# Patient Record
Sex: Male | Born: 1980
Health system: Southern US, Community
[De-identification: ages and names within clinical notes are randomized; demographics above are authoritative.]

## PROBLEM LIST (undated history)

## (undated) DIAGNOSIS — B019 Varicella without complication: Secondary | ICD-10-CM

## (undated) DIAGNOSIS — S83249A Other tear of medial meniscus, current injury, unspecified knee, initial encounter: Secondary | ICD-10-CM

## (undated) DIAGNOSIS — M7630 Iliotibial band syndrome, unspecified leg: Secondary | ICD-10-CM

## (undated) DIAGNOSIS — M79673 Pain in unspecified foot: Secondary | ICD-10-CM

## (undated) HISTORY — DX: Varicella without complication: B01.9

## (undated) HISTORY — PX: HERNIA REPAIR: SHX51

## (undated) HISTORY — DX: Pain in unspecified foot: M79.673

## (undated) HISTORY — PX: KNEE SURGERY: SHX244

## (undated) HISTORY — PX: ADENOIDECTOMY: SUR15

## (undated) HISTORY — DX: Other tear of medial meniscus, current injury, unspecified knee, initial encounter: S83.249A

## (undated) HISTORY — DX: Iliotibial band syndrome, unspecified leg: M76.30

---
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
## 1998-05-22 HISTORY — PX: MANDIBLE FRACTURE SURGERY: SHX706

## 2010-02-21 ENCOUNTER — Ambulatory Visit: Payer: Self-pay | Admitting: Family Medicine

## 2010-02-21 DIAGNOSIS — M25579 Pain in unspecified ankle and joints of unspecified foot: Secondary | ICD-10-CM | POA: Insufficient documentation

## 2010-02-21 DIAGNOSIS — M25569 Pain in unspecified knee: Secondary | ICD-10-CM | POA: Insufficient documentation

## 2010-02-28 ENCOUNTER — Encounter (INDEPENDENT_AMBULATORY_CARE_PROVIDER_SITE_OTHER): Payer: Self-pay | Admitting: *Deleted

## 2010-02-28 ENCOUNTER — Encounter: Payer: Self-pay | Admitting: Family Medicine

## 2010-03-02 ENCOUNTER — Encounter
Admission: RE | Admit: 2010-03-02 | Discharge: 2010-04-19 | Payer: Self-pay | Source: Home / Self Care | Admitting: Family Medicine

## 2010-03-11 ENCOUNTER — Encounter: Payer: Self-pay | Admitting: Family Medicine

## 2010-04-26 ENCOUNTER — Ambulatory Visit: Payer: Self-pay | Admitting: Sports Medicine

## 2010-04-26 DIAGNOSIS — M79609 Pain in unspecified limb: Secondary | ICD-10-CM

## 2010-05-31 ENCOUNTER — Ambulatory Visit
Admission: RE | Admit: 2010-05-31 | Discharge: 2010-05-31 | Payer: Self-pay | Source: Home / Self Care | Attending: Sports Medicine | Admitting: Sports Medicine

## 2010-06-23 NOTE — Assessment & Plan Note (Signed)
Summary: ORTHOTICS,[facility]   CC:  orthotics.  History of Present Illness: 30yo male to office for custom orthotics.  Has been having left foot/ankle pain for several months, last office vist noted to have small avulsion at tarsal-tarsal junction between cuboid & lateral cuneiform on MSK u/s.  He continues to have pain with inversion/eversion of the foot.  Denies any swelling.  Able to run 3-miles this weekend without pain.  No pain with running as long as not doing any cutting or side-to-side movement.  Using sports insoles in shoes which are comfortable.  Here for custom orthotics today.  Physical Exam  General:  Well-developed,well-nourished,in no acute distress; alert,appropriate and cooperative throughout examination Msk:  KNEES: b/l knees with FROM without pain or swelling.  No ligamentous laxity.  No tenderness.  Normal strength.  ANKLES:  - Lt ankle: no swelling.  Full ROM, some foot pain with inversion & eversion.  No laxity with anterior drawer or talar tilt.  No TTP along medial/lateral malleoli.  No tenderness along talar dome.  neg squezze, neg kleiger.  Normal strength. - Rt ankle: Full ROM without pain, swelling, tenderness, laxity, or weakness.  FEET:  bilateral pes planus & loss of transverse arch.  b/l morton's callous.  mild TTP over tarsal-tarsal junction of cubiod & lateral cuneiform on left, no swelling.  Increased pain in this area with inversion/eversion of the foot.  No erythema or bruising.  No tenderness on right foot.  GAIT: no leg length difference.  Mild pronation on left with walking. Pulses:  +2/4 DP & PT Neurologic:  sensation intact to light touch.     Impression & Recommendations:  Problem # 1:  FOOT PAIN, LEFT (ICD-729.5)  - small avulsion at tarsal-tarsal joint between cuboid & lateral cuneiform with surrounding fluid that was seen on MSK u/s last visit - Fitted with custom orthotics in office today: Patient was fitted for a : standard, cushioned,  semi-rigid orthotic. The orthotic was heated and afterward the patient stood on the orthotic blank positioned on the orthotic stand. The patient was positioned in subtalar neutral position and 10 degrees of ankle dorsiflexion in a weight bearing stance. After completion of molding, a stable base was applied to the orthotic blank. The blank was ground to a stable position for weight bearing. Size: 16 - blue swirl Base: blue EVA foam Posting: none Additional orthotic padding: none Orthotic comfortable to patient in office today. 45-mins spent with patient for orthotics preparation - cont. arch straps for added compression - advance activity as tolerated - f/u prn  Orders: Orthotic Materials, each unit (Z6109)   Orders Added: 1)  Est. Patient Level IV [60454] 2)  Orthotic Materials, each unit [L3002]

## 2010-06-23 NOTE — Assessment & Plan Note (Signed)
Summary: 4PM APPT/WOULD ALSO LIKE TO SEE WITH BOTH [facility]   Vitals Entered By: Lillia Pauls CMA (April 27, 2010 2:14 PM)  CC:  f/u L foot/ankle pain.  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  Pain has been ongoing for several months.  Pain is mainly in anterior aspect of ankle, but also on dorsal aspect of foot.  Underwent intra-articular ankle injection by Dr. Jennette Kettle 1-week ago which improved ankle pain, but now having more prominent foot pain.  Pain most noticeable at rest, but does have some pain with activity.  Pain worst with inversion or eversion of foot.  Has mild swelling, no erythema or bruising.  Has been using sports insoles in his shoes.    Is a runner, has completed marathons in the past.  Has done fair amount of trail running & likely injured foot, but does not recall any specific event.  Has not been running recently.  During last visit had some right knee pain, but this has completely resolved. PMH-FH-SH reviewed for relevance  Review of Systems      See HPI  Physical Exam  General:  Well-developed,well-nourished,in no acute distress; alert,appropriate and cooperative throughout examination Msk:  KNEES: b/l knees with FROM without pain or swelling.  No ligamentous laxity.  Neg mcmurray.  No joint line tenderness.  Good hamstring & quad strength.  ANKLES:  - Lt ankle: no swelling.  Full ROM, some foot pain with inversion & eversion.  No laxity with anterior drawer or talar tilt.  No TTP along medial/lateral malleoli.  No tenderness along talar dome.  neg squezze, neg kleiger.  Normal strength. - Rt ankle: Full ROM without pain, swelling, tenderness, laxity, or weakness.  FEET:  bilateral pes planus & loss of transverse arch.  TTP over tarsal-tarsal junction of cubiod & lateral cuneiform on left, some mild soft tissue swelling in this area.  Increased pain in this area with  inversion/eversion of the foot.  Mild tenderness along 2nd, 3rd, 4th MTP joints.  No erythema or bruising.  No tenderness on right foot. Additional Exam:  MSK U/S: L foot - normal appearing MTs and MTP joints.  Small hyperechoic area noted at tarsal-tarsal joint between cuboid and lateral cuneiform - likely small avusion fleck.  This fleck is surrounding by fluid that has appearance of bursal sack.  Ultrasound findings correlate with area of maximal tenderness.  Images saved.   Impression & Recommendations:  Problem # 1:  FOOT PAIN, LEFT (ICD-729.5)  - MSK U/S showed likely small avulsion at tarsal-tarsal joint between cuboid & lateral cuneiform with surrounding fluid - Fitted with arch straps for added compression - Ok to advance activity as pain allows - Return to office in 2-weeks for custom orthotics  Orders: [place] LIMITED ([id number]) Foot Orthosis ( Arch Strap/Heel Cup) [phone])  Problem # 2:  ANKLE PAIN, LEFT (ICD-719.47) Assessment: Improved  - Improved after intra-articular injection.   - Custom orthotics should also help his ankle pain  Orders: [place] LIMITED ([id number]) Foot Orthosis ( Arch Strap/Heel Cup) [phone])   Orders Added: 1)  Est. Patient Level III [29562] 2)  [place] LIMITED [13086] 3)  Foot Orthosis ( Arch Strap/Heel Cup) [V7846]

## 2010-06-23 NOTE — Miscellaneous (Signed)
  Clinical Lists Changes  Observations: Added new observation of MSK EXAM: Right knee has indistinct endpoint on posterior drawer and + sag sign. There is an endpoint on the anterior drawer but it is not as fiem as the left knee. Negative McMurray, Small knee effusion. TTP latearlly over the fibular head.  GAIT analysis: runniong gait is symmetrical with no obvious problems. A buit of a short stride length. (02/28/2010 9:45) Added new observation of PEADULT: Denny Levy MD ~Additional Exam`PE comments ~Msk`MSK EXAM (02/28/2010 9:45) Added new observation of PE COMMENTS: [place] right knee--small effusion in suprapatellar pouch. Popliteus muscle appears ragged and seems split into at least two fragments as itnears the femur. Some calcifications surrounding popliteus and lateral meniscus--the meniscus is indistinct. the medial meniscus seems thinner at femoral portion but no specific tear is noted and borders are distinct. (02/28/2010 9:45)      Impression & Recommendations:  Problem # 1:  KNEE PAIN, RIGHT (ICD-719.46) unstable knee--previous PCL tear that was not repaired. also s/p meniscal injury with subsequent scope. Discussed options and wuld recommend a good stabilizing brace--rx given and he will go to [facility]. We looked at the [facility] on line. Also will send to PT--he is basically a runner not a cyclist so he may be too hamstring diominant.   Physical Exam  Msk:  Right knee has indistinct endpoint on posterior drawer and + sag sign. There is an endpoint on the anterior drawer but it is not as fiem as the left knee. Negative McMurray, Small knee effusion. TTP latearlly over the fibular head.  GAIT analysis: runniong gait is symmetrical with no obvious problems. A buit of a short stride length. Additional Exam:  [place] right knee--small effusion in suprapatellar pouch. Popliteus muscle appears ragged and seems split into at least two fragments as itnears the femur. Some calcifications surrounding  popliteus and lateral meniscus--the meniscus is indistinct. the medial meniscus seems thinner at femoral portion but no specific tear is noted and borders are distinct.

## 2010-06-23 NOTE — Letter (Signed)
Summary: [facility] PT Referral form  [facility] PT Referral form   Imported By: Marily Memos 02/28/2010 12:23:38  _____________________________________________________________________  External Attachment:    Type:   Image     Comment:   External Document

## 2010-06-23 NOTE — Miscellaneous (Signed)
Summary: [facility]  [facility] rehab center   Imported By: Marily Memos 04/21/2010 14:01:34  _____________________________________________________________________  External Attachment:    Type:   Image     Comment:   External Document

## 2010-06-23 NOTE — Miscellaneous (Signed)
  Clinical Lists Changes  Orders: Added new Referral order of Physical Therapy Referral (PT) - Signed

## 2010-06-23 NOTE — Assessment & Plan Note (Signed)
Summary: NP,LEFT ANKLE PAIN,MC   Vital Signs:  Patient profile:   30 year old male Height:      76 inches Weight:      230 pounds BMI:     28.10 Pulse rate:   42 / minute BP sitting:   127 / 79  (left arm)  Vitals Entered By: Rochele Pages RN (February 21, 2010 2:10 PM) CC: Lt ankle/foot pain, rt lateral knee pain   CC:  Lt ankle/foot pain and rt lateral knee pain.  History of Present Illness: left ankle pain---off and n for several (6) moths, worse last 4 weeks as he has been training for a marathon in November. Has run marathon before. Last two days has been so painful he has not run at all. Pain is anterior ankle, a sharp sensation. Can reproduce it with a quick dorsiflexion of ankle starting from plantar flexed position.  Aching pain after he feels the sharp pain. Worse as he runs  > 3 miles.  Also having right ITB pain. Has had this intermittently before but worse now. Pain with quad stretch  Current Medications (verified): 1)  None  Past History:  Past Surgical History: Right PCL tear, rigt knee surgery  Physical Exam  General:  alert, well-developed, well-nourished, and well-hydrated.   Msk:  Right knee TTP at hamstring insertion and along distal ITB. FROM Knee. No effusion, no erythema. No joint line tenderness.  B feet pes planus, loss of transverse arch. Pain ober anterior joint line with rapid dorsif;exion from a plantarflexed start.  [place]: edema around insertion of right hamstring.  ANKLE: bone spurs seen anterior joint line. No ankle effusion Additional Exam:  GAIT eval walking: left ankle :somewhat mobilesubtalar joint noted during stance and toe off phase   Impression & Recommendations:  Problem # 1:  KNEE PAIN, RIGHT (ICD-719.46)  Problem # 2:  ANKLE PAIN, LEFT (ICD-719.47) Would benefit from running gait analysis--will rtc for that and custom orthotics

## 2010-11-25 ENCOUNTER — Other Ambulatory Visit: Payer: Self-pay | Admitting: Family Medicine

## 2010-11-25 MED ORDER — CIPROFLOXACIN HCL 500 MG PO TABS: 500.0000 mg | ORAL_TABLET | Freq: Two times a day (BID) | ORAL | Status: DC

## 2010-12-05 ENCOUNTER — Ambulatory Visit (INDEPENDENT_AMBULATORY_CARE_PROVIDER_SITE_OTHER): Payer: Self-pay | Admitting: Family Medicine

## 2010-12-05 ENCOUNTER — Encounter: Payer: Self-pay | Admitting: Family Medicine

## 2010-12-05 VITALS — BP 124/74 | HR 64 | Temp 98.7°F

## 2010-12-05 DIAGNOSIS — R509 Fever, unspecified: Secondary | ICD-10-CM | POA: Insufficient documentation

## 2010-12-05 LAB — CBC WITH DIFFERENTIAL/PLATELET
Eosinophils Absolute: 0 10*3/uL (ref 0.0–0.7)
Eosinophils Relative: 0 % (ref 0–5)
HCT: 46.6 % (ref 39.0–52.0)
Hemoglobin: 16.3 g/dL (ref 13.0–17.0)
Lymphs Abs: 1.2 10*3/uL (ref 0.7–4.0)
MCH: 32.7 pg (ref 26.0–34.0)
MCV: 93.4 fL (ref 78.0–100.0)
Monocytes Relative: 16 % — ABNORMAL HIGH (ref 3–12)
RBC: 4.99 MIL/uL (ref 4.22–5.81)

## 2010-12-05 LAB — POCT UA - MICROSCOPIC ONLY

## 2010-12-05 LAB — POCT URINALYSIS DIPSTICK
Glucose, UA: NEGATIVE
Leukocytes, UA: NEGATIVE
pH, UA: 5.5

## 2010-12-05 NOTE — Assessment & Plan Note (Addendum)
Likely mosquito borne illness dengue, less likely malaria.  Nonspecific viral illness also possible. Will check CBC and Malaria smear  Given CVAT will check UA. No specific treatment other than analgesics for pain

## 2010-12-05 NOTE — Progress Notes (Signed)
  Subjective:    Patient ID: Ronald Durham, male    DOB: Aug 25, 1980, 30 y.o.   MRN: [id number]  HPI  Fever Fever started Sunday after he returned from a week in the mountains of [place] on Saturday where he was in contact with mosquitos.  Had a transient GI symptoms of diarrhea and vomiting without blood or fever on Wednesday took cipro for 3 days which he has finished. Currently has diffuse body aches, with headache and low back pain L > R.   No stiff neck, rash, cough, URI, dysuria, penile discharge, adenopathy  PMH - no prior tick bites this year.  No chronic meds or illnesses   Review of Symptoms - see HPI       Review of Systems     Objective:   Physical Exam     Alert ill appearing in NAD Neck:  No deformities, thyromegaly, masses, or tenderness noted.   Supple with full range of motion without pain. Lungs:  Normal respiratory effort, chest expands symmetrically. Lungs are clear to auscultation, no crackles or wheezes. Heart - Regular rate and rhythm.  No murmurs, gallops or rubs.    Abdomen: soft and non-tender without masses, organomegaly or hernias noted.  No guarding or rebound, No spleen Extremities:  No cyanosis, edema, or deformity noted with good range of motion of all major joints.   Skin:  Intact without suspicious lesions or rashes Back - diffusely tender including CVAT on the R > L  Eye - Pupils Equal Round Reactive to light, Extraocular movements intact, , Conjunctiva without redness or discharge or jaundice    Assessment & Plan:

## 2010-12-05 NOTE — Progress Notes (Signed)
Addended by: [place], Esiah Bazinet on: 12/05/2010 12:11 PM   Modules accepted: Orders

## 2010-12-06 LAB — MALARIA SMEAR: Result - MALAR: NEGATIVE

## 2011-03-23 ENCOUNTER — Other Ambulatory Visit: Payer: Self-pay | Admitting: Family Medicine

## 2011-03-23 MED ORDER — NITROGLYCERIN 0.2 MG/HR TD PT24: MEDICATED_PATCH | TRANSDERMAL | Status: DC

## 2011-05-29 ENCOUNTER — Ambulatory Visit
Admission: RE | Admit: 2011-05-29 | Discharge: 2011-05-29 | Disposition: A | Discharge status: Home / Self Care | Payer: 59 | Source: Ambulatory Visit | Attending: Family Medicine | Admitting: Family Medicine

## 2011-05-29 ENCOUNTER — Telehealth: Payer: Self-pay | Admitting: Family Medicine

## 2011-05-29 DIAGNOSIS — M79672 Pain in left foot: Secondary | ICD-10-CM

## 2011-05-29 NOTE — Telephone Encounter (Signed)
Dr. Gwendolyn Grant has left foot pain for >1 year on dorsal aspect of tarsals along 3rd ray. Xray requested.

## 2011-06-28 ENCOUNTER — Other Ambulatory Visit: Payer: Self-pay | Admitting: Family Medicine

## 2011-06-28 ENCOUNTER — Encounter: Payer: Self-pay | Admitting: *Deleted

## 2011-06-28 ENCOUNTER — Other Ambulatory Visit: Payer: Self-pay | Admitting: *Deleted

## 2011-06-28 ENCOUNTER — Other Ambulatory Visit (HOSPITAL_COMMUNITY): Payer: Self-pay | Admitting: *Deleted

## 2011-06-28 DIAGNOSIS — M79609 Pain in unspecified limb: Secondary | ICD-10-CM

## 2011-06-28 DIAGNOSIS — M25579 Pain in unspecified ankle and joints of unspecified foot: Secondary | ICD-10-CM

## 2011-06-28 NOTE — Progress Notes (Signed)
Patient ID: Ronald Durham, male   DOB: February 26, 1981, 31 y.o.   MRN: [id number] Your appt for the MRA is on Friday, February the 8th at 8am at [facility]. Call [phone] if you have questions with the appt time and date.  Arrive at 7:45 in admitting for registration.

## 2011-06-28 NOTE — Progress Notes (Signed)
Amy can you schedule this  As an MRA---I put in for mri and I cannot remember how to do it  for Dr Gwendolyn Grant? [phone]  Mid afternoon or after 5 pm best THANKS! Denny Levy

## 2011-06-28 NOTE — Progress Notes (Signed)
[place] scheduled test.

## 2011-06-29 ENCOUNTER — Other Ambulatory Visit: Payer: Self-pay | Admitting: Family Medicine

## 2011-06-29 DIAGNOSIS — M79609 Pain in unspecified limb: Secondary | ICD-10-CM

## 2011-06-29 DIAGNOSIS — M25579 Pain in unspecified ankle and joints of unspecified foot: Secondary | ICD-10-CM

## 2011-06-30 ENCOUNTER — Inpatient Hospital Stay (HOSPITAL_COMMUNITY): Admission: RE | Admit: 2011-06-30 | Payer: 59 | Source: Ambulatory Visit

## 2011-07-04 ENCOUNTER — Other Ambulatory Visit (HOSPITAL_COMMUNITY): Payer: 59

## 2011-07-04 ENCOUNTER — Inpatient Hospital Stay (HOSPITAL_COMMUNITY): Admission: RE | Admit: 2011-07-04 | Payer: 59 | Source: Ambulatory Visit

## 2011-07-13 ENCOUNTER — Ambulatory Visit (HOSPITAL_COMMUNITY)
Admission: RE | Admit: 2011-07-13 | Discharge: 2011-07-13 | Disposition: A | Discharge status: Home / Self Care | Payer: 59 | Source: Ambulatory Visit | Attending: Family Medicine | Admitting: Family Medicine

## 2011-07-13 DIAGNOSIS — M25579 Pain in unspecified ankle and joints of unspecified foot: Secondary | ICD-10-CM | POA: Insufficient documentation

## 2011-07-13 DIAGNOSIS — M79609 Pain in unspecified limb: Secondary | ICD-10-CM

## 2011-07-13 MED ORDER — IOHEXOL 300 MG/ML  SOLN
50.0000 mL | Freq: Once | INTRAMUSCULAR | Status: AC | PRN
  Administered 2011-07-13: 10 mL

## 2011-07-13 MED ORDER — GADOBENATE DIMEGLUMINE 529 MG/ML IV SOLN
5.0000 mL | Freq: Once | INTRAVENOUS | Status: AC | PRN
  Administered 2011-07-13: 0.1 mL via INTRAVENOUS

## 2011-07-13 NOTE — Procedures (Signed)
Clinical Data: [Left ankle pain.] Fluoroscopically guided left tibiotalar joint injection, pre MRI Comparison: [05/29/2011] Procedure:  I discussed the risks (including hemorrhage and infection, among others), benefits, and alternatives to fluoroscopically guided left tibia talar joint injection for MR arthrography with the patient.  We discussed high likelihood of technical success of the procedure.  He understood and  elected to undergo the procedure. Standard procedural and fluoroscopic time-outs were employed. Following sterile skin prep and local subcutaneous anesthetic administration consisting of 1% lidocaine, and after carefully localizing to avoid the dorsalis pedis location and extensor tendons, I advanced a 21 gauge needle into the lateral portion of the tibiotalar joint from an anterior approach under fluoroscopic guidance.  Positioning was confirmed with contrast injection of Omnipaque 300. Subsequently, 10 ml of a combination of 0.1 ml of Multihance dilated in 15 ml of Omnipaque 300 was injected into the joint.  The needle was subsequently removed and the skin cleansed and bandaged.  No immediate complications were observed. IMPRESSION: [ 1.  Technically successful left tibiotalar joint injection, pre MRI.  No immediate complications were observed.]

## 2011-07-16 ENCOUNTER — Telehealth (HOSPITAL_COMMUNITY): Payer: Self-pay | Admitting: Radiology

## 2011-09-09 ENCOUNTER — Other Ambulatory Visit: Payer: Self-pay | Admitting: Family Medicine

## 2011-09-09 MED ORDER — FLUTICASONE PROPIONATE 50 MCG/ACT NA SUSP: 2.0000 | Freq: Every day | NASAL | Status: DC

## 2011-09-09 MED ORDER — OLOPATADINE HCL 0.1 % OP SOLN: 1.0000 [drp] | Freq: Two times a day (BID) | OPHTHALMIC | Status: AC

## 2011-09-18 ENCOUNTER — Telehealth: Payer: Self-pay | Admitting: Family Medicine

## 2011-09-18 MED ORDER — PROMETHAZINE HCL 12.5 MG PO TABS: 12.5000 mg | ORAL_TABLET | Freq: Three times a day (TID) | ORAL | Status: DC | PRN

## 2011-09-18 NOTE — Telephone Encounter (Signed)
Pt traveling to [place].  Would like nausea medications to have incase viral gastroenteritis.  Calling in phenergan.

## 2012-06-03 ENCOUNTER — Ambulatory Visit: Payer: 59 | Admitting: Family Medicine

## 2012-06-06 ENCOUNTER — Ambulatory Visit (INDEPENDENT_AMBULATORY_CARE_PROVIDER_SITE_OTHER): Payer: 59 | Admitting: Family Medicine

## 2012-06-06 ENCOUNTER — Encounter: Payer: Self-pay | Admitting: Family Medicine

## 2012-06-06 VITALS — BP 102/70 | HR 55 | Temp 98.2°F | Ht 76.0 in | Wt 230.0 lb

## 2012-06-06 DIAGNOSIS — Z7689 Persons encountering health services in other specified circumstances: Secondary | ICD-10-CM

## 2012-06-06 DIAGNOSIS — Z7189 Other specified counseling: Secondary | ICD-10-CM

## 2012-06-06 DIAGNOSIS — D239 Other benign neoplasm of skin, unspecified: Secondary | ICD-10-CM

## 2012-06-06 DIAGNOSIS — D229 Melanocytic nevi, unspecified: Secondary | ICD-10-CM

## 2012-06-06 DIAGNOSIS — L658 Other specified nonscarring hair loss: Secondary | ICD-10-CM

## 2012-06-06 DIAGNOSIS — L649 Androgenic alopecia, unspecified: Secondary | ICD-10-CM

## 2012-06-06 MED ORDER — FINASTERIDE 5 MG PO TABS: 5.0000 mg | ORAL_TABLET | Freq: Every day | ORAL | Status: DC

## 2012-06-06 NOTE — Patient Instructions (Addendum)
-  We have ordered labs or studies at this visit. It can take up to 1-2 weeks for results and processing. We will contact you with instructions IF your results are abnormal. Normal results will be released to your MYCHART. If you have not heard from us or can not find your results in MYCHART in 2 weeks please contact our office.  -PLEASE SIGN UP FOR MYCHART TODAY   We recommend the following healthy lifestyle measures: - eat a healthy diet consisting of lots of vegetables, fruits, beans, nuts, seeds, healthy meats such as white chicken and fish and whole grains.  - avoid fried foods, fast food, processed foods, sodas, red meet and other fattening foods.  - get a least 150 minutes of aerobic exercise per week.   Follow up in: 1 year  

## 2012-06-06 NOTE — Progress Notes (Addendum)
Chief Complaint  Patient presents with  . Establish Care    HPI:  Ronald Durham is here to establish care.  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 KNEE PAIN, RIGHT  . ANKLE PAIN, LEFT  . FOOT PAIN, LEFT  . Fever   Health Maintenance: -had flu and up to day on all the vaccines  ROS: See pertinent positives and negatives per HPI.  Past Medical History  Diagnosis Date  . Foot pain   . Acute medial meniscal tear   . IT band syndrome     History reviewed. No pertinent family history.  History   Social History  . Marital Status: Married    Spouse Name: N/A    Number of Children: N/A  . Years of Education: N/A   Social History Main Topics  . Smoking status: Never Smoker   . Smokeless tobacco: None  . Alcohol Use: Yes     Comment: occ - 1 drink on occassion  . Drug Use: No  . Sexually Active: None   Other Topics Concern  . None   Social History Narrative  . None    Current outpatient prescriptions:fluticasone (FLONASE) 50 MCG/ACT nasal spray, Place 2 sprays into the nose daily., Disp: 16 g, Rfl: 6;  Multiple Vitamin (MULTIVITAMIN) tablet, Take 1 tablet by mouth daily., Disp: , Rfl: ;  olopatadine (PATANOL) 0.1 % ophthalmic solution, Place 1 drop into both eyes 2 (two) times daily., Disp: 5 mL,  Rfl: 12 finasteride (PROSCAR) 5 MG tablet, Take 1 tablet (5 mg total) by mouth daily. Use one quarter tablet daily for balding, Disp: 30 tablet, Rfl: 3  EXAM:  Filed Vitals:   06/06/12 1440  BP: 102/70  Pulse: 55  Temp: 98.2 F (36.8 C)    Body mass index is 28.00 kg/(m^2).  GENERAL: vitals reviewed and listed above, alert, oriented, appears well hydrated and in no acute distress  HEENT: no obvious abnormalities on inspection of external nose and ears  LUNGS: clear to auscultation bilaterally, no wheezes, rales or rhonchi, good air movement  CV: HRRR - slow HR which he reports is chronic in this avid runner, no peripheral edema  MS: moves all extremities without noticeable abnormality  SKIN: few moles on abdomen and back that appear similar and benign in appearance, male pattern hair loss - no scarring  PSYCH: pleasant and cooperative, no obvious depression or anxiety  ASSESSMENT AND PLAN:  Discussed the following assessment and plan:  1. Male pattern baldness  -discussed options -pt wants to try finasteride - discussed use and risks, prefers to cut 5mg  tablet, declined PSA monitoring finasteride (PROSCAR) 5 MG tablet  2. Benign moles Offered removal versus dermatology appointment to confirm benign nevus - though moles look similar with no concerning features or notable acute changes per pt. He declined  removal or derm referral for now - but will contact us if changes his mind.  3. Encounter to establish care with new doctor  Offered basic labs - pt would like to hold off on these for now   -We reviewed the PMH, PSH, FH, SH, Meds and Allergies. -We provided refills for any medications we will prescribe as needed. -We addressed current concerns per orders and patient instructions. -We have advised patient to follow up per instructions below.  -Patient advised to return or notify a doctor immediately if symptoms worsen or persist or new concerns arise.  Patient  Instructions  -We have ordered labs or studies at this visit. It can take up to 1-2 weeks for results and processing. We will contact you with instructions IF your results are abnormal. Normal results will be released to your [facility]. If you have not heard from [place] or can not find your results in [facility] in 2 weeks please contact our office.  -PLEASE SIGN UP FOR MYCHART TODAY   We recommend the following healthy lifestyle measures: - eat a healthy diet consisting of lots of vegetables, fruits, beans, nuts, seeds, healthy meats such as white chicken and fish and whole grains.  - avoid fried foods, fast food, processed foods, sodas, red meet and other fattening foods.  - get a least 150 minutes of aerobic exercise per week.   Follow up in: 1 year      Saranya Harlin, Damita Lack.

## 2012-06-07 ENCOUNTER — Other Ambulatory Visit: Payer: Self-pay

## 2012-06-07 DIAGNOSIS — L649 Androgenic alopecia, unspecified: Secondary | ICD-10-CM

## 2012-06-07 MED ORDER — FINASTERIDE 5 MG PO TABS: ORAL_TABLET | ORAL | Status: DC

## 2012-06-07 NOTE — Telephone Encounter (Signed)
Rx resent for proscar- use one quater tablet daily for balding.

## 2012-07-30 ENCOUNTER — Other Ambulatory Visit: Payer: Self-pay | Admitting: Family Medicine

## 2012-07-30 MED ORDER — TRIAMCINOLONE ACETONIDE 0.5 % EX OINT: TOPICAL_OINTMENT | Freq: Two times a day (BID) | CUTANEOUS | Status: DC

## 2012-07-30 NOTE — Telephone Encounter (Signed)
Rash on BL forearms after clearing trees.  Hx of skin hypersensitive to evergreen trees.  No other symptoms. Well otherwise.  Present several days and not improving with OTC hydrocortisone.   Exam:  Maculopapular rash on bl forearms.  A/P hypersensitively vs contact dermatitis.  Plan: Triamcinolone ointment f/u prn

## 2013-03-27 ENCOUNTER — Other Ambulatory Visit: Payer: Self-pay

## 2014-03-06 ENCOUNTER — Other Ambulatory Visit: Payer: Self-pay

## 2015-10-25 DIAGNOSIS — M9902 Segmental and somatic dysfunction of thoracic region: Secondary | ICD-10-CM | POA: Diagnosis not present

## 2015-10-25 DIAGNOSIS — M9901 Segmental and somatic dysfunction of cervical region: Secondary | ICD-10-CM | POA: Diagnosis not present

## 2015-10-25 DIAGNOSIS — M75100 Unspecified rotator cuff tear or rupture of unspecified shoulder, not specified as traumatic: Secondary | ICD-10-CM | POA: Diagnosis not present

## 2015-10-25 DIAGNOSIS — M791 Myalgia: Secondary | ICD-10-CM | POA: Diagnosis not present

## 2015-10-25 DIAGNOSIS — M6240 Contracture of muscle, unspecified site: Secondary | ICD-10-CM | POA: Diagnosis not present

## 2015-10-26 MED FILL — CIPROFLOXACIN HCL 500 MG TA: 500 | 3 days supply | Qty: 6 | Fill #0

## 2015-10-26 MED FILL — ZOLPIDEM TARTRATE 10 MG TAB: 10 | 15 days supply | Qty: 15 | Fill #0

## 2015-10-26 MED FILL — DOXYCYCLINE HYCLATE 100 MG: 100 | 41 days supply | Qty: 41 | Fill #0

## 2015-10-26 MED FILL — ONDANSETRON HCL 4 MG TABLET: 4 | 4 days supply | Qty: 15 | Fill #0

## 2015-10-27 DIAGNOSIS — M9902 Segmental and somatic dysfunction of thoracic region: Secondary | ICD-10-CM | POA: Diagnosis not present

## 2015-10-27 DIAGNOSIS — M791 Myalgia: Secondary | ICD-10-CM | POA: Diagnosis not present

## 2015-10-27 DIAGNOSIS — M9901 Segmental and somatic dysfunction of cervical region: Secondary | ICD-10-CM | POA: Diagnosis not present

## 2015-10-27 DIAGNOSIS — M75100 Unspecified rotator cuff tear or rupture of unspecified shoulder, not specified as traumatic: Secondary | ICD-10-CM | POA: Diagnosis not present

## 2015-10-27 DIAGNOSIS — M6240 Contracture of muscle, unspecified site: Secondary | ICD-10-CM | POA: Diagnosis not present

## 2016-02-04 DIAGNOSIS — M9902 Segmental and somatic dysfunction of thoracic region: Secondary | ICD-10-CM | POA: Diagnosis not present

## 2016-02-04 DIAGNOSIS — M791 Myalgia: Secondary | ICD-10-CM | POA: Diagnosis not present

## 2016-02-04 DIAGNOSIS — R51 Headache: Secondary | ICD-10-CM | POA: Diagnosis not present

## 2016-02-04 DIAGNOSIS — M9901 Segmental and somatic dysfunction of cervical region: Secondary | ICD-10-CM | POA: Diagnosis not present

## 2016-02-28 DIAGNOSIS — M791 Myalgia: Secondary | ICD-10-CM | POA: Diagnosis not present

## 2016-02-28 DIAGNOSIS — R51 Headache: Secondary | ICD-10-CM | POA: Diagnosis not present

## 2016-02-28 DIAGNOSIS — M9902 Segmental and somatic dysfunction of thoracic region: Secondary | ICD-10-CM | POA: Diagnosis not present

## 2016-02-28 DIAGNOSIS — M9901 Segmental and somatic dysfunction of cervical region: Secondary | ICD-10-CM | POA: Diagnosis not present

## 2016-05-09 DIAGNOSIS — D225 Melanocytic nevi of trunk: Secondary | ICD-10-CM | POA: Diagnosis not present

## 2016-05-09 DIAGNOSIS — L814 Other melanin hyperpigmentation: Secondary | ICD-10-CM | POA: Diagnosis not present

## 2016-05-10 ENCOUNTER — Telehealth: Payer: 59 | Admitting: Nurse Practitioner

## 2016-05-10 DIAGNOSIS — J0101 Acute recurrent maxillary sinusitis: Secondary | ICD-10-CM | POA: Diagnosis not present

## 2016-05-10 MED ORDER — IPRATROPIUM BROMIDE 0.03 % NA SOLN: 2.0000 | Freq: Two times a day (BID) | NASAL | 12 refills | Status: DC

## 2016-05-10 MED ORDER — AZITHROMYCIN 250 MG PO TABS: ORAL_TABLET | ORAL | 0 refills | Status: DC

## 2016-05-10 NOTE — Progress Notes (Signed)
We are sorry that you are not feeling well.  Here is how we plan to help!  Based on what you have shared with me it looks like you have sinusitis.  Sinusitis is inflammation and infection in the sinus cavities of the head.  Based on your presentation I believe you most likely have Acute Bacterial sinusitis.  This is an infection caused by bacteria and is treated with antibiotics.  I have prescribed z paK, an antibiotic in the macrolide family, to be taken as directed. Also sent in atrovent nasal spray.  You may use an oral decongestant such as Mucinex D or if you have glaucoma or high blood pressure use plain Mucinex.  Saline nasal sprays help an can sefely be used as often as needed for congestion.  If you develop worsening sinus pain, fever or notice severe headache and vision changes, or if symptoms are not better after completion of antibiotic, please schedule an appointment with a health care provider.  Sinus infections are not as easily transmitted as other respiratory infection, however we still recommend that you avoid close contact with loved ones, especially the very young and elderly.  Remember to wash your hands thoroughly throughout the day as this is the number one way to prevent the spread of infection!  Home Care: Only take medications as instructed by your medical team. Complete the entire course of an antibiotic. Do not take these medications with alcohol. A steam or ultrasonic humidifier can help congestion.  You can place a towel over your head and breathe in the steam from hot water coming from a faucet. Avoid close contacts especially the very young and the elderly. Cover your mouth when you cough or sneeze. Always remember to wash your hands.  Get Help Right Away If: You develop worsening fever or sinus pain. You develop a severe head ache or visual changes. Your symptoms persist after you have completed your treatment plan.  Make sure you Understand these  instructions. Will watch your condition. Will get help right away if you are not doing well or get worse.  Your e-visit answers were reviewed by a board certified advanced clinical practitioner to complete your personal care plan.  Depending on the condition, your plan could have included both over the counter or prescription medications.

## 2016-09-28 ENCOUNTER — Telehealth: Payer: 59 | Admitting: Physician Assistant

## 2016-09-28 DIAGNOSIS — B9689 Other specified bacterial agents as the cause of diseases classified elsewhere: Secondary | ICD-10-CM | POA: Diagnosis not present

## 2016-09-28 DIAGNOSIS — J019 Acute sinusitis, unspecified: Secondary | ICD-10-CM

## 2016-09-28 MED ORDER — AMOXICILLIN-POT CLAVULANATE 875-125 MG PO TABS
1.0000 | ORAL_TABLET | Freq: Two times a day (BID) | ORAL | 0 refills | Status: DC
Start: 2016-09-28 — End: 2016-10-23

## 2016-09-28 NOTE — Progress Notes (Signed)

## 2016-10-07 ENCOUNTER — Telehealth: Payer: 59 | Admitting: Nurse Practitioner

## 2016-10-07 DIAGNOSIS — J0101 Acute recurrent maxillary sinusitis: Secondary | ICD-10-CM

## 2016-10-07 MED ORDER — AZITHROMYCIN 250 MG PO TABS: ORAL_TABLET | ORAL | 0 refills | Status: DC

## 2016-10-07 NOTE — Progress Notes (Signed)

## 2016-10-23 ENCOUNTER — Ambulatory Visit (INDEPENDENT_AMBULATORY_CARE_PROVIDER_SITE_OTHER): Payer: 59 | Admitting: Physician Assistant

## 2016-10-23 ENCOUNTER — Encounter: Payer: Self-pay | Admitting: Physician Assistant

## 2016-10-23 ENCOUNTER — Ambulatory Visit (INDEPENDENT_AMBULATORY_CARE_PROVIDER_SITE_OTHER): Payer: 59

## 2016-10-23 VITALS — BP 122/73 | HR 41 | Temp 97.7°F | Resp 18 | Ht 76.0 in | Wt 224.8 lb

## 2016-10-23 DIAGNOSIS — M79644 Pain in right finger(s): Secondary | ICD-10-CM

## 2016-10-23 DIAGNOSIS — S6991XA Unspecified injury of right wrist, hand and finger(s), initial encounter: Secondary | ICD-10-CM | POA: Diagnosis not present

## 2016-10-23 NOTE — Patient Instructions (Signed)
     IF you received an x-ray today, you will receive an invoice from [facility]. Please contact [facility] at [phone] with questions or concerns regarding your invoice.   IF you received labwork today, you will receive an invoice from [facility]. Please contact [facility] at [phone] with questions or concerns regarding your invoice.   Our billing staff will not be able to assist you with questions regarding bills from these companies.  You will be contacted with the lab results as soon as they are available. The fastest way to get your results is to activate your My Chart account. Instructions are located on the last page of this paperwork. If you have not heard from us regarding the results in 2 weeks, please contact this office.     

## 2016-10-23 NOTE — Progress Notes (Signed)
   Ronald Durham  MRN: [id number] DOB: Sep 16, 1980  PCP: Ronald Kern, DO  Chief Complaint  Patient presents with  . Hand Pain    right 5th finger     Subjective:  Pt presents to clinic for pain in his right little finger since yesterday when he was playing basketball and he jammed his finger against the ball.  It is swollen and bruised - he feels like as the day has gone on it has improved but he wants to make sure it is not broken.  Review of Systems  Patient Active Problem List   Diagnosis Date Noted  . Fever 12/05/2010  . FOOT PAIN, LEFT 04/26/2010  . KNEE PAIN, RIGHT 02/21/2010  . ANKLE PAIN, LEFT 02/21/2010    No current outpatient prescriptions on file prior to visit.   No current facility-administered medications on file prior to visit.     No Known Allergies  Pt patients past, family and social history were reviewed and updated.   Objective:  BP 122/73   Pulse (!) 41   Temp 97.7 F (36.5 C) (Oral)   Resp 18   Ht 6\' 4"  (1.93 m)   Wt 224 lb 12.8 oz (102 kg)   SpO2 98%   BMI 27.36 kg/m   Physical Exam  Constitutional: He is oriented to person, place, and time and well-developed, well-nourished, and in no distress.  HENT:  Head: Normocephalic and atraumatic.  Right Ear: External ear normal.  Left Ear: External ear normal.  Eyes: Conjunctivae are normal.  Neck: Normal range of motion.  Pulmonary/Chest: Effort normal.  Musculoskeletal:       Right hand: He exhibits decreased range of motion (2nd to swelling), tenderness (over PIP more on the radial and ulnar asopects of the joint - no joint laxity) and swelling. He exhibits normal capillary refill, no deformity and no laceration.       Left hand: Normal.       Hands: Neurological: He is alert and oriented to person, place, and time. Gait normal.  Skin: Skin is warm and dry.  Psychiatric: Mood, memory, affect and judgment normal.   Dg Finger Little Right  Result Date: 10/23/2016 CLINICAL DATA:   Pain after trauma yesterday. EXAM: RIGHT LITTLE FINGER 2+V COMPARISON:  None. FINDINGS: There is no evidence of fracture or dislocation. There is no evidence of arthropathy or other focal bone abnormality. Soft tissues are unremarkable. IMPRESSION: Negative. Electronically Signed   By: Lorriane Shire M.D.   On: 10/23/2016 17:28     Assessment and Plan :  Pain of finger of right hand - Plan: DG Finger Little Right - no fracture - likely sprain but no laxity in the radial collateral ligament felt which is where the bruising is located - he will buddy tape and ice and monitor - RTC is problems,  Windell Hummingbird PA-C  [facility] 10/23/2016 9:45 PM

## 2016-10-26 ENCOUNTER — Other Ambulatory Visit: Payer: Self-pay | Admitting: Physician Assistant

## 2016-10-26 MED ORDER — DOXYCYCLINE HYCLATE 100 MG PO CAPS: 100.0000 mg | ORAL_CAPSULE | Freq: Two times a day (BID) | ORAL | 0 refills | Status: DC

## 2016-10-26 NOTE — Progress Notes (Signed)
Meds ordered this encounter  Medications  . doxycycline (VIBRAMYCIN) 100 MG capsule    Sig: Take 1 capsule (100 mg total) by mouth 2 (two) times daily.    Dispense:  20 capsule    Refill:  0    Order Specific Question:   Supervising Provider    Answer:   SMITH, KRISTI M [[id number]]     

## 2016-12-29 DIAGNOSIS — M542 Cervicalgia: Secondary | ICD-10-CM | POA: Diagnosis not present

## 2016-12-29 DIAGNOSIS — M9901 Segmental and somatic dysfunction of cervical region: Secondary | ICD-10-CM | POA: Diagnosis not present

## 2016-12-29 DIAGNOSIS — M546 Pain in thoracic spine: Secondary | ICD-10-CM | POA: Diagnosis not present

## 2016-12-29 DIAGNOSIS — M791 Myalgia: Secondary | ICD-10-CM | POA: Diagnosis not present

## 2016-12-29 DIAGNOSIS — M9902 Segmental and somatic dysfunction of thoracic region: Secondary | ICD-10-CM | POA: Diagnosis not present

## 2017-01-04 DIAGNOSIS — M546 Pain in thoracic spine: Secondary | ICD-10-CM | POA: Diagnosis not present

## 2017-01-04 DIAGNOSIS — M9902 Segmental and somatic dysfunction of thoracic region: Secondary | ICD-10-CM | POA: Diagnosis not present

## 2017-01-04 DIAGNOSIS — M542 Cervicalgia: Secondary | ICD-10-CM | POA: Diagnosis not present

## 2017-01-04 DIAGNOSIS — M791 Myalgia: Secondary | ICD-10-CM | POA: Diagnosis not present

## 2017-01-04 DIAGNOSIS — M9901 Segmental and somatic dysfunction of cervical region: Secondary | ICD-10-CM | POA: Diagnosis not present

## 2017-01-17 DIAGNOSIS — M9901 Segmental and somatic dysfunction of cervical region: Secondary | ICD-10-CM | POA: Diagnosis not present

## 2017-01-17 DIAGNOSIS — M542 Cervicalgia: Secondary | ICD-10-CM | POA: Diagnosis not present

## 2017-01-17 DIAGNOSIS — M546 Pain in thoracic spine: Secondary | ICD-10-CM | POA: Diagnosis not present

## 2017-01-17 DIAGNOSIS — M791 Myalgia: Secondary | ICD-10-CM | POA: Diagnosis not present

## 2017-01-17 DIAGNOSIS — M9902 Segmental and somatic dysfunction of thoracic region: Secondary | ICD-10-CM | POA: Diagnosis not present

## 2017-02-08 ENCOUNTER — Encounter: Payer: Self-pay | Admitting: Family Medicine

## 2017-05-31 ENCOUNTER — Encounter: Payer: Self-pay | Admitting: Family Medicine

## 2017-06-13 DIAGNOSIS — F4321 Adjustment disorder with depressed mood: Secondary | ICD-10-CM | POA: Diagnosis not present

## 2017-07-18 DIAGNOSIS — F4321 Adjustment disorder with depressed mood: Secondary | ICD-10-CM | POA: Diagnosis not present

## 2017-08-23 DIAGNOSIS — H5211 Myopia, right eye: Secondary | ICD-10-CM | POA: Diagnosis not present

## 2017-08-23 DIAGNOSIS — H5202 Hypermetropia, left eye: Secondary | ICD-10-CM | POA: Diagnosis not present

## 2017-08-23 DIAGNOSIS — H52223 Regular astigmatism, bilateral: Secondary | ICD-10-CM | POA: Diagnosis not present

## 2017-12-24 ENCOUNTER — Encounter: Payer: Self-pay | Admitting: Family Medicine

## 2017-12-24 ENCOUNTER — Ambulatory Visit: Payer: 59 | Admitting: Family Medicine

## 2017-12-24 VITALS — BP 121/72 | HR 41 | Ht 76.0 in | Wt 230.0 lb

## 2017-12-24 DIAGNOSIS — S8991XA Unspecified injury of right lower leg, initial encounter: Secondary | ICD-10-CM | POA: Diagnosis not present

## 2017-12-24 NOTE — Patient Instructions (Signed)
I'm concerned you tore your ACL and may have a meniscus tear. We will go ahead with an MRI to further assess. Ice 15 minutes at a time 3-4 times a day. Aleve 2 tabs twice a day with food OR ibuprofen 600mg  three times a day with food for pain and inflammation. Elevate above your heart as much as possible. Knee brace for support if this feels more comfortable. Crutch as you have been. Knee extensions, straight leg raises 3 sets of 10 once a day. I will call you with your MRI results and next steps.

## 2017-12-24 NOTE — Progress Notes (Signed)
PCP: Lucretia Kern, DO  Subjective:   HPI: Patient is a 37 y.o. male here for right knee pain.  He injured his knee 3 days ago playing paddle ball on the beach.  Patient states he stepped forward and his knee hyperextended.  Reports hearing a pop and was unable to immediately bear weight.  He says there was swelling for about 1 day but this resolved with ice and elevation.  He denies any erythema.  Reports feeling unstable and has been using crutches.  Overall his pain is 4/10 and is worse if he tries to pivot on his foot.  Has also been taking ibuprofen.  He denies any locking.  He denies any numbness or tingling.  No overlying skin changes.  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  He states they did not perform a PCL reconstruction.  Past Medical History:  Diagnosis Date  . Acute medial meniscal tear   . Chicken pox   . Foot pain   . IT band syndrome     Current Outpatient Medications on File Prior to Visit  Medication Sig Dispense Refill  . doxycycline (VIBRAMYCIN) 100 MG capsule Take 1 capsule (100 mg total) by mouth 2 (two) times daily. 20 capsule 0   No current facility-administered medications on file prior to visit.     Past Surgical History:  Procedure Laterality Date  . ADENOIDECTOMY    . HERNIA REPAIR    . KNEE SURGERY    . MANDIBLE FRACTURE SURGERY  2000  . mensucal repair  1995    No Known Allergies  Social History   Socioeconomic History  . Marital status: Married    Spouse name: Not on file  . Number of children: Not on file  . Years of education: Not on file  . Highest education level: Not on file  Occupational History  . Not on file  Social Needs  . Financial resource strain: Not on file  . Food insecurity:    Worry: Not on file    Inability: Not on file  . Transportation needs:    Medical: Not on file    Non-medical: Not on file  Tobacco Use  . Smoking status: Never  Smoker  . Smokeless tobacco: Never Used  Substance and Sexual Activity  . Alcohol use: Yes    Comment: occ - 1 drink on occassion  . Drug use: No  . Sexual activity: Not on file  Lifestyle  . Physical activity:    Days per week: Not on file    Minutes per session: Not on file  . Stress: Not on file  Relationships  . Social connections:    Talks on phone: Not on file    Gets together: Not on file    Attends religious service: Not on file    Active member of club or organization: Not on file    Attends meetings of clubs or organizations: Not on file    Relationship status: Not on file  . Intimate partner violence:    Fear of current or ex partner: Not on file    Emotionally abused: Not on file    Physically abused: Not on file    Forced sexual activity: Not on file  Other Topics Concern  . Not on file  Social History Narrative  . Not on file    No family history on file.  BP 121/72  Pulse (!) 41   Ht 6\' 4"  (1.93 m)   Wt 230 lb (104.3 kg)   BMI 28.00 kg/m   Review of Systems: See HPI above.     Objective:  Physical Exam:  Gen: awake, alert, NAD, comfortable in exam room Pulm: breathing unlabored  Right Knee: - Inspection: no gross deformity. Moderate effusion. No erythema or bruising. Skin intact - Palpation: mild TTP over the medial and lateral joint line - ROM: slightly limited extension. Limited Flexion - Strength: full strength on resisted flexion and extension - Neuro/vasc: Normal sensation - Special Tests: - LIGAMENTS: 1+ positive Lachman's with apparent endpoint, no MCL or LCL laxity  -- MENISCUS: pain with mcmuray's and [place]ssaly  -- PF JOINT: nml patellar mobility bilaterally. negative patellar apprehension  [place]: moderate effusion observed. quadraceps and patellar tendons intact.  Mild medial meniscus extrusion without obvious tear.  Hips: negative log roll   Left Knee: - Inspection: no gross deformity. No swelling/effusion, erythema or bruising.  Skin intact - ROM: full ROM - Strength: 5/5 strength - Neuro/vasc: Normal sensation - Special Tests: - LIGAMENTS: negative anterior and posterior drawer, negative Lachman's, no MCL or LCL laxity    Assessment & Plan:  1.  Acute right knee pain- Laxity of ACL compared to left knee but apparent end point - concerned however with his instability, this laxity, effusion that he has ACL tear +/- meniscal involvement. - NSAIDs as needed.  Aleve 2 tablets twice daily or ibuprofen 600 to 800 mg 3 times a day with food - Continue to ice 15 minutes 3-4 times per day - Continue to elevate - Will provide with knee brace for support - Continue to use crutches as needed - We will order MRI for further evaluation.

## 2017-12-25 ENCOUNTER — Encounter: Payer: Self-pay | Admitting: Family Medicine

## 2017-12-26 NOTE — Addendum Note (Signed)
Addended by: Sherrie George F on: 12/26/2017 08:43 AM   Modules accepted: Orders

## 2017-12-27 ENCOUNTER — Ambulatory Visit
Admission: RE | Admit: 2017-12-27 | Discharge: 2017-12-27 | Disposition: A | Discharge status: Home / Self Care | Payer: 59 | Source: Ambulatory Visit | Attending: Family Medicine | Admitting: Family Medicine

## 2017-12-27 DIAGNOSIS — M23221 Derangement of posterior horn of medial meniscus due to old tear or injury, right knee: Secondary | ICD-10-CM | POA: Diagnosis not present

## 2017-12-27 DIAGNOSIS — S8991XA Unspecified injury of right lower leg, initial encounter: Secondary | ICD-10-CM

## 2018-01-07 NOTE — Addendum Note (Signed)
Addended by: Sherrie George F on: 01/07/2018 04:35 PM   Modules accepted: Orders

## 2018-01-10 DIAGNOSIS — N50812 Left testicular pain: Secondary | ICD-10-CM | POA: Diagnosis not present

## 2018-01-24 ENCOUNTER — Encounter: Payer: Self-pay | Admitting: Physical Therapy

## 2018-01-24 ENCOUNTER — Other Ambulatory Visit: Payer: Self-pay

## 2018-01-24 ENCOUNTER — Ambulatory Visit: Payer: 59 | Attending: Family Medicine | Admitting: Physical Therapy

## 2018-01-24 DIAGNOSIS — M25561 Pain in right knee: Secondary | ICD-10-CM | POA: Diagnosis not present

## 2018-01-24 DIAGNOSIS — G8929 Other chronic pain: Secondary | ICD-10-CM | POA: Diagnosis not present

## 2018-01-24 DIAGNOSIS — R2689 Other abnormalities of gait and mobility: Secondary | ICD-10-CM | POA: Insufficient documentation

## 2018-01-24 NOTE — Therapy (Signed)
[place], [place], [address] Phone: [phone]   Fax:  [phone]  Physical Therapy Evaluation  Patient Details  Name: Ronald Durham MRN: [id number] Date of Birth: May 02, 1981 Referring Provider: Karlton Lemon MD   Encounter Date: 01/24/2018  PT End of Session - 01/24/18 1524    Visit Number  1    Number of Visits  13    Date for PT Re-Evaluation  03/07/18    Authorization Type  [employer]    PT Start Time  1418    PT Stop Time  1501    PT Time Calculation (min)  43 min    Activity Tolerance  Patient tolerated treatment well    Behavior During Therapy  [facility] for tasks assessed/performed       Past Medical History:  Diagnosis Date  . Acute medial meniscal tear   . Chicken pox   . Foot pain   . IT band syndrome     Past Surgical History:  Procedure Laterality Date  . ADENOIDECTOMY    . HERNIA REPAIR    . KNEE SURGERY    . MANDIBLE FRACTURE SURGERY  2000  . mensucal repair  1995    There were no vitals filed for this visit.   Subjective Assessment - 01/24/18 1427    Subjective  pt is a 37 y.o with CC of R knee pain that start beginning of August when he slid in the sand hyperextending his knee and felt a pop with painpain. since onset the pain has gotten better because he was initally on crutches. but has had some popping clicking giving away but nothing recently. pain was along the anteriomedial aspect but but has gotten since onset. pt reports significant hx of R knee pain in the past with previoius meniscal tears/ surgery and possible PCL injury.    How long can you sit comfortably?  unlimited    How long can you stand comfortably?  unlimited    How long can you walk comfortably?  unlmited    Diagnostic tests  MRI    Patient Stated Goals  get back to running, and high end exercise and plyometrics.     Currently in Pain?  Yes    Pain Score  0-No pain   at worst 2-3/10   Pain  Orientation  Right;Anterior;Medial    Pain Descriptors / Indicators  Sharp    Pain Type  Chronic pain    Pain Onset  More than a month ago    Pain Frequency  Occasional    Aggravating Factors   catching the door with foot/ varus force, and twisting,     Pain Relieving Factors  ice and medication, PRN,          [facility] PT Assessment - 01/24/18 1424      Assessment   Medical Diagnosis  R knee pain    Referring Provider  Karlton Lemon MD    Onset Date/Surgical Date  --   early august 2019   Hand Dominance  Right    Next MD Visit  make on PRN    Prior Therapy  yes      Precautions   Precaution Comments  no running or pivoting for 6 weeks      Restrictions   Weight Bearing Restrictions  No      Balance Screen   Has the patient fallen in the past 6 months  No  Home Environment   Living Environment  Private residence    Living Arrangements  Spouse/significant other    Available Help at Discharge  Family;Available PRN/intermittently    Type of Home  House    Home Access  Stairs to enter    Entrance Stairs-Number of Steps  2    Entrance Stairs-Rails  None    Home Layout  One level    Home Equipment  Crutches      Prior Function   Level of Independence  Independent    Vocation  Full time employment    Vocation Requirements  prolonged standing/ walking    Leisure  running, out doors hiking, camping      Cognition   Overall Cognitive Status  Within Functional Limits for tasks assessed      Observation/Other Assessments   Focus on Therapeutic Outcomes (FOTO)   31% limited   predicted 20% limited     Posture/Postural Control   Posture/Postural Control  Postural limitations      ROM / Strength   AROM / PROM / Strength  AROM;Strength;PROM      AROM   AROM Assessment Site  Knee;Hip    Right/Left Hip  Right;Left    Right/Left Knee  Left;Right    Right Knee Extension  0    Right Knee Flexion  132    Left Knee Extension  0    Left Knee Flexion  142      PROM    PROM Assessment Site  Knee    Right/Left Knee  Right    Right Knee Flexion  140    end range pain      Strength   Strength Assessment Site  Hip;Knee;Ankle    Right/Left Hip  Right;Left    Right Hip Flexion  5/5    Right Hip Extension  4+/5    Right Hip External Rotation   4/5    Right Hip Internal Rotation  5/5    Right Hip ABduction  5/5    Right Hip ADduction  4+/5    Left Hip Flexion  5/5    Left Hip Extension  4+/5    Left Hip External Rotation  4+/5    Left Hip Internal Rotation  5/5    Left Hip ABduction  4+/5    Left Hip ADduction  4-/5    Right/Left Knee  Right;Left    Right Knee Flexion  5/5    Right Knee Extension  5/5    Left Knee Flexion  5/5    Left Knee Extension  5/5    Right/Left Ankle  Right;Left                Objective measurements completed on examination: See above findings.      [facility] Adult PT Treatment/Exercise - 01/24/18 1424      Exercises   Exercises  Knee/Hip      Knee/Hip Exercises: Supine   Quad Sets  Strengthening;Both;1 set;10 reps   with pillow squeeze for VMO activation     Knee/Hip Exercises: Sidelying   Other Sidelying Knee/Hip Exercises  R hip ER in R sidelying 1 x 15             PT Education - 01/24/18 1523    Education Details  evaluation findings, POC, goals, HEP with proper form/ rationale    Person(s) Educated  Patient    Methods  Explanation;Verbal cues;Handout;Demonstration    Comprehension  Verbalized understanding;Verbal cues required;Returned demonstration  PT Short Term Goals - 01/24/18 1550      PT SHORT TERM GOAL #1   Title  pt to be I with inital HEp given     Time  3    Period  Weeks    Status  New    Target Date  02/14/18        PT Long Term Goals - 01/24/18 1550      PT LONG TERM GOAL #1   Title  pt to increase bil LE strength to >/= 4+/5 in all planes to promote hip/ knee stability with prlonged walking/ standing and running activities reporting  </= 1/10 pain     Time  6     Period  Weeks    Status  New    Target Date  03/07/18      PT LONG TERM GOAL #2   Title  pt will be able to perform jumping and dynamic activities reporting </= 1/10 pain for return to PLOF     Time  6    Period  Weeks    Status  New      PT LONG TERM GOAL #3   Title  He will be able to run for >/= 30 min  reporting </= 1/10 pain for pt's personal go of returning to running activities     Time  6    Period  Weeks    Status  New    Target Date  03/07/18      PT LONG TERM GOAL #4   Title  Increase FOTO score to </= 20% limited to demo improvement in function     Time  6    Period  Weeks    Status  New    Target Date  03/07/18      PT LONG TERM GOAL #5   Title  pt to be I with all HEP given as of last visit to maintain and progress current level of function    Time  6    Period  Weeks    Status  New    Target Date  03/07/18             Plan - 01/24/18 1526    Clinical Impression Statement  pt presents to OPP[facility] with R knee pain starting August due to hyperextension sliding in the sand while he was at the beach. functional mobility but when compared bil mild knee flexion deficit on the R. mild weakness noted in R hip ER, l hip abductors and bil adductors. plan to assess pt's running form next session. He would benefit from physical therapy to improve hip/ knee strength, promote dynamic stability and reduce knee pain with activity and return to PLOF by addressing the deficits listed.     Clinical Presentation  Stable    Clinical Decision Making  Low    Rehab Potential  Good    PT Frequency  2x / week    PT Duration  6 weeks    PT Treatment/Interventions  ADLs/Self Care Home Management;Cryotherapy;Electrical Stimulation;Moist Heat;Iontophoresis 4mg /ml Dexamethasone;Dry needling;Passive range of motion;Gait training;Stair training;Functional mobility training;Therapeutic activities;Therapeutic exercise;Manual techniques    PT Next Visit Plan  review/ update HEP PRN, assess  running mechanics using ipad on treadmill, continued hip strengthening, and precursor to dynamic plyometric activites.     PT Home Exercise Plan  quad set with ball squeeze, hip ER in sidelhing, calf stretching, and sidelying hip abduction,     Consulted and Agree with  Plan of Care  Patient       Patient will benefit from skilled therapeutic intervention in order to improve the following deficits and impairments:  Abnormal gait, Pain, Decreased strength, Decreased activity tolerance, Decreased balance, Postural dysfunction, Improper body mechanics  Visit Diagnosis: Chronic pain of right knee  Other abnormalities of gait and mobility     Problem List Patient Active Problem List   Diagnosis Date Noted  . Fever 12/05/2010  . FOOT PAIN, LEFT 04/26/2010  . KNEE PAIN, RIGHT 02/21/2010  . ANKLE PAIN, LEFT 02/21/2010   Starr Lake PT, DPT, LAT, ATC  01/24/18  3:54 PM      [place] [facility] [address] [place], [place], [address] Phone: [phone]   Fax:  [phone]  Name: BOYDE GRIECO MRN: [id number] Date of Birth: 06-Jan-1981

## 2018-02-05 MED FILL — ATOVAQUONE-PROGUANIL 250-10: 250-100 | 24 days supply | Qty: 24 | Fill #0

## 2018-02-05 MED FILL — AZITHROMYCIN 500 MG TABLET: 500 | 4 days supply | Qty: 4 | Fill #0

## 2018-02-05 MED FILL — ZOLPIDEM TARTRATE 10 MG TAB: 10 | 15 days supply | Qty: 15 | Fill #0

## 2018-02-07 ENCOUNTER — Ambulatory Visit: Payer: 59 | Admitting: Physical Therapy

## 2018-02-07 ENCOUNTER — Telehealth: Payer: Self-pay | Admitting: Physical Therapy

## 2018-02-07 NOTE — Telephone Encounter (Signed)
Left message regarding missed visit. Patient advised of his next visit and asked to call if he did not think he was going to make it.

## 2018-02-25 ENCOUNTER — Ambulatory Visit: Payer: 59 | Attending: Family Medicine | Admitting: Physical Therapy

## 2018-02-25 ENCOUNTER — Encounter: Payer: Self-pay | Admitting: Physical Therapy

## 2018-02-25 DIAGNOSIS — M25561 Pain in right knee: Secondary | ICD-10-CM | POA: Insufficient documentation

## 2018-02-25 DIAGNOSIS — G8929 Other chronic pain: Secondary | ICD-10-CM | POA: Diagnosis not present

## 2018-02-25 DIAGNOSIS — R2689 Other abnormalities of gait and mobility: Secondary | ICD-10-CM | POA: Diagnosis not present

## 2018-02-26 ENCOUNTER — Encounter: Payer: Self-pay | Admitting: Physical Therapy

## 2018-02-26 NOTE — Therapy (Signed)
[place], [place], [address] Phone: [phone]   Fax:  [phone]  Physical Therapy Treatment  Patient Details  Name: Ronald Durham MRN: [id number] Date of Birth: [birth year] Referring Provider (PT): Karlton Lemon MD   Encounter Date: 02/25/2018  PT End of Session - 02/25/18 1708    Visit Number  2    Number of Visits  13    Date for PT Re-Evaluation  03/07/18    Authorization Type  [place] [employer]    PT Start Time  1632    PT Stop Time  1712    PT Time Calculation (min)  40 min    Activity Tolerance  Patient tolerated treatment well    Behavior During Therapy  [facility] for tasks assessed/performed       Past Medical History:  Diagnosis Date  . Acute medial meniscal tear   . Chicken pox   . Foot pain   . IT band syndrome     Past Surgical History:  Procedure Laterality Date  . ADENOIDECTOMY    . HERNIA REPAIR    . KNEE SURGERY    . MANDIBLE FRACTURE SURGERY  2000  . mensucal repair  1995    There were no vitals filed for this visit.  Subjective Assessment - 02/26/18 0840    Subjective  Patient has been in [place] for 2 weeks. He reported no knee pain just ankle pain. His ankle is not hurting him too bad today. He is mostly having pain when he is standing.     How long can you sit comfortably?  unlimited    How long can you stand comfortably?  unlimited    How long can you walk comfortably?  unlmited    Diagnostic tests  MRI    Patient Stated Goals  get back to running, and high end exercise and plyometrics.     Currently in Pain?  Yes    Pain Score  0-No pain    Pain Orientation  Right;Anterior;Medial    Pain Descriptors / Indicators  Sharp    Pain Type  Chronic pain    Pain Onset  More than a month ago    Pain Frequency  Occasional    Aggravating Factors   walking and running too far     Pain Relieving Factors  ice and medication     Multiple Pain Sites  No                        [facility] Adult PT Treatment/Exercise - 02/26/18 0001      Ambulation/Gait   Gait Comments  reviewed gait on tredmill at 4.5 and 5.5 mph; at 4.5 slight decrease in stride length and single leg stance on the right but good on the left.       Exercises   Exercises  Knee/Hip      Knee/Hip Exercises: Stretches   Active Hamstring Stretch  2 reps;30 seconds    Quad Stretch Limitations  thomas stretch 2x20 sec each side       Knee/Hip Exercises: Machines for Strengthening   Cybex Leg Press  eccentric 2x10 right leg 60 lb 2x10       Knee/Hip Exercises: Standing   Other Standing Knee Exercises  lateral band walk 2x10 green; cone drill to counter 2x10; yellow; eccentric heel tocuh 2x10 4 inch stretch 2x10; d2 kettle bell single leg  2x10;  PT Education - 02/25/18 1708    Education Details  updated HEP     Person(s) Educated  Patient    Methods  Explanation;Demonstration;Tactile cues;Verbal cues    Comprehension  Verbalized understanding;Returned demonstration;Verbal cues required;Tactile cues required       PT Short Term Goals - 02/26/18 0908      PT SHORT TERM GOAL #1   Title  pt to be I with inital HEp given     Baseline  uodated HEP     Time  3    Period  Weeks    Status  On-going        PT Long Term Goals - 01/24/18 1550      PT LONG TERM GOAL #1   Title  pt to increase bil LE strength to >/= 4+/5 in all planes to promote hip/ knee stability with prlonged walking/ standing and running activities reporting  </= 1/10 pain     Time  6    Period  Weeks    Status  New    Target Date  03/07/18      PT LONG TERM GOAL #2   Title  pt will be able to perform jumping and dynamic activities reporting </= 1/10 pain for return to PLOF     Time  6    Period  Weeks    Status  New      PT LONG TERM GOAL #3   Title  He will be able to run for >/= 30 min  reporting </= 1/10 pain for pt's personal go of returning to running activities      Time  6    Period  Weeks    Status  New    Target Date  03/07/18      PT LONG TERM GOAL #4   Title  Increase FOTO score to </= 20% limited to demo improvement in function     Time  6    Period  Weeks    Status  New    Target Date  03/07/18      PT LONG TERM GOAL #5   Title  pt to be I with all HEP given as of last visit to maintain and progress current level of function    Time  6    Period  Weeks    Status  New    Target Date  03/07/18            Plan - 02/26/18 0842    Clinical Impression Statement  Thearpy assessed patients running in slow speed. He had no significant gait deviations with running. He has a slight decrease in single leg stance at slower speeds but as his speed increases his gait improves. Therapy gave him a base return to running program. He tolerated treatment well. he did feel like he had a lack of control with eccentric leg press. Therapy will assess his tolerance to treasment next visit.     Clinical Presentation  Stable    Clinical Decision Making  Low    Rehab Potential  Good    PT Frequency  2x / week    PT Duration  6 weeks    PT Treatment/Interventions  ADLs/Self Care Home Management;Cryotherapy;Electrical Stimulation;Moist Heat;Iontophoresis 4mg /ml Dexamethasone;Dry needling;Passive range of motion;Gait training;Stair training;Functional mobility training;Therapeutic activities;Therapeutic exercise;Manual techniques    PT Next Visit Plan  review/ update HEP PRN, assess running mechanics using ipad on treadmill, continued hip strengthening, and precursor to dynamic plyometric activites.  PT Home Exercise Plan  quad set with ball squeeze, hip ER in sidelhing, calf stretching, and sidelying hip abduction,     Consulted and Agree with Plan of Care  Patient       Patient will benefit from skilled therapeutic intervention in order to improve the following deficits and impairments:  Abnormal gait, Pain, Decreased strength, Decreased activity  tolerance, Decreased balance, Postural dysfunction, Improper body mechanics  Visit Diagnosis: Chronic pain of right knee  Other abnormalities of gait and mobility     Problem List Patient Active Problem List   Diagnosis Date Noted  . Fever 12/05/2010  . FOOT PAIN, LEFT 04/26/2010  . KNEE PAIN, RIGHT 02/21/2010  . ANKLE PAIN, LEFT 02/21/2010    Carney Living PT DPT  02/26/2018, 9:26 AM  [facility] [address] [place], [place], [address] Phone: [phone]   Fax:  [phone]  Name: SIYON LINCK MRN: [id number] Date of Birth: 28-Apr-1981

## 2018-02-28 ENCOUNTER — Encounter: Payer: Self-pay | Admitting: Physical Therapy

## 2018-02-28 ENCOUNTER — Ambulatory Visit: Payer: 59 | Admitting: Physical Therapy

## 2018-02-28 DIAGNOSIS — R2689 Other abnormalities of gait and mobility: Secondary | ICD-10-CM | POA: Diagnosis not present

## 2018-02-28 DIAGNOSIS — G8929 Other chronic pain: Secondary | ICD-10-CM | POA: Diagnosis not present

## 2018-02-28 DIAGNOSIS — M25561 Pain in right knee: Secondary | ICD-10-CM | POA: Diagnosis not present

## 2018-03-01 NOTE — Therapy (Signed)
[place], [place], [address] Phone: [phone]   Fax:  [phone]  Physical Therapy Treatment  Patient Details  Name: Ronald Durham MRN: [id number] Date of Birth: [birth year] Referring Provider (PT): Karlton Lemon MD   Encounter Date: 02/28/2018  PT End of Session - 02/28/18 1633    Visit Number  3    Number of Visits  13    Date for PT Re-Evaluation  03/07/18    Authorization Type  [place] [employer]    Activity Tolerance  Patient tolerated treatment well    Behavior During Therapy  [facility] for tasks assessed/performed       Past Medical History:  Diagnosis Date  . Acute medial meniscal tear   . Chicken pox   . Foot pain   . IT band syndrome     Past Surgical History:  Procedure Laterality Date  . ADENOIDECTOMY    . HERNIA REPAIR    . KNEE SURGERY    . MANDIBLE FRACTURE SURGERY  2000  . mensucal repair  1995    There were no vitals filed for this visit.                    [facility] Adult PT Treatment/Exercise - 03/01/18 0001      Exercises   Exercises  Knee/Hip      Knee/Hip Exercises: Standing   Other Standing Knee Exercises  assessed eccentric heel raise pre-manual. patiient had ankle pain with end range PF. After manual no pain with end range PF/. Performed 2x10. Felt some fatigue    Other Standing Knee Exercises  cable walk 2x10 with emphasis on toe off 5x forward and back 7.5 kg. launges with cuing for satride length; Lateral band walk 2x10; eccentric step down 2x10;       Modalities   Modalities  Iontophoresis;Ultrasound      Ultrasound   Ultrasound Location  right medial achillies     Ultrasound Parameters  50% 3 mz1.5 w/cm 2     Ultrasound Goals  Pain      Iontophoresis   Type of Iontophoresis  Dexamethasone    Location  to medial posterior schillies     Dose  1cc     Time  slow release       Manual Therapy   Manual Therapy  Joint mobilization;Soft tissue  mobilization    Manual therapy comments  soft tissue mobilization of the gastroc; manual PF and DF stretching; calcaneal mobilization     Joint Mobilization  AP glides of the ankle              PT Education - 02/28/18 1633    Education Details  reiviewed HEP and symptom mangement     Person(s) Educated  Patient    Methods  Explanation;Tactile cues;Verbal cues;Demonstration    Comprehension  Verbalized understanding;Returned demonstration;Verbal cues required;Tactile cues required       PT Short Term Goals - 02/26/18 0908      PT SHORT TERM GOAL #1   Title  pt to be I with inital HEp given     Baseline  uodated HEP     Time  3    Period  Weeks    Status  On-going        PT Long Term Goals - 01/24/18 1550      PT LONG TERM GOAL #1   Title  pt to increase bil LE strength to >/= 4+/5  in all planes to promote hip/ knee stability with prlonged walking/ standing and running activities reporting  </= 1/10 pain     Time  6    Period  Weeks    Status  New    Target Date  03/07/18      PT LONG TERM GOAL #2   Title  pt will be able to perform jumping and dynamic activities reporting </= 1/10 pain for return to PLOF     Time  6    Period  Weeks    Status  New      PT LONG TERM GOAL #3   Title  He will be able to run for >/= 30 min  reporting </= 1/10 pain for pt's personal go of returning to running activities     Time  6    Period  Weeks    Status  New    Target Date  03/07/18      PT LONG TERM GOAL #4   Title  Increase FOTO score to </= 20% limited to demo improvement in function     Time  6    Period  Weeks    Status  New    Target Date  03/07/18      PT LONG TERM GOAL #5   Title  pt to be I with all HEP given as of last visit to maintain and progress current level of function    Time  6    Period  Weeks    Status  New    Target Date  03/07/18            Plan - 02/28/18 1651    Clinical Impression Statement  Patient is showing signs of minor achillies  tendinitis. This is likley effecting his mechanics with gait and running and effecting his knee. Physcial therapy focued on the ankle today to help improve his mechanics ; The patient had pain with end range plantar flexion; Therapy performed range and mobilizations to improve his ability to PF with running. Therapy also added ultrasound and ionto to achillies in the hope that if is ankle pain improves his mechanics with running will improve taking stress off the knee.     Clinical Presentation  Stable    Clinical Decision Making  Low    Rehab Potential  Good    PT Frequency  2x / week    PT Duration  6 weeks    PT Treatment/Interventions  ADLs/Self Care Home Management;Cryotherapy;Electrical Stimulation;Moist Heat;Iontophoresis 4mg /ml Dexamethasone;Dry needling;Passive range of motion;Gait training;Stair training;Functional mobility training;Therapeutic activities;Therapeutic exercise;Manual techniques    PT Next Visit Plan  Assess tolerance to last treatment. Continue to work on LE stabilization     PT Home Exercise Plan  quad set with ball squeeze, hip ER in sidelhing, calf stretching, and sidelying hip abduction,     Consulted and Agree with Plan of Care  Patient       Patient will benefit from skilled therapeutic intervention in order to improve the following deficits and impairments:  Abnormal gait, Pain, Decreased strength, Decreased activity tolerance, Decreased balance, Postural dysfunction, Improper body mechanics  Visit Diagnosis: Chronic pain of right knee  Other abnormalities of gait and mobility     Problem List Patient Active Problem List   Diagnosis Date Noted  . Fever 12/05/2010  . FOOT PAIN, LEFT 04/26/2010  . KNEE PAIN, RIGHT 02/21/2010  . ANKLE PAIN, LEFT 02/21/2010    Carney Living PT DPT  03/01/2018, 1:07 PM   Einar Crow SPT  03/01/2018   During this treatment session, the therapist was present, participating in and directing the treatment.  [place], [place], [address] Phone: [phone]   Fax:  [phone]  Name: Ronald Durham MRN: [id number] Date of Birth: [birth year]

## 2018-03-05 ENCOUNTER — Encounter: Payer: Self-pay | Admitting: Physical Therapy

## 2018-03-05 ENCOUNTER — Ambulatory Visit: Payer: 59 | Admitting: Physical Therapy

## 2018-03-05 DIAGNOSIS — G8929 Other chronic pain: Secondary | ICD-10-CM | POA: Diagnosis not present

## 2018-03-05 DIAGNOSIS — M25561 Pain in right knee: Secondary | ICD-10-CM | POA: Diagnosis not present

## 2018-03-05 DIAGNOSIS — R2689 Other abnormalities of gait and mobility: Secondary | ICD-10-CM | POA: Diagnosis not present

## 2018-03-05 NOTE — Therapy (Signed)
[place], [place], [address] Phone: [phone]   Fax:  [phone]  Physical Therapy Treatment  Patient Details  Name: Ronald Durham MRN: [id number] Date of Birth: [birth year] Referring Provider (PT): Karlton Lemon MD   Encounter Date: 03/05/2018  PT End of Session - 03/05/18 1726    Visit Number  4    Number of Visits  13    Date for PT Re-Evaluation  03/07/18    Authorization Type  [place] [employer]    PT Start Time  1638   pt arrived 8 min late   PT Stop Time  1718    PT Time Calculation (min)  40 min    Activity Tolerance  Patient tolerated treatment well    Behavior During Therapy  [facility] for tasks assessed/performed       Past Medical History:  Diagnosis Date  . Acute medial meniscal tear   . Chicken pox   . Foot pain   . IT band syndrome     Past Surgical History:  Procedure Laterality Date  . ADENOIDECTOMY    . HERNIA REPAIR    . KNEE SURGERY    . MANDIBLE FRACTURE SURGERY  2000  . mensucal repair  1995    There were no vitals filed for this visit.  Subjective Assessment - 03/05/18 1638    Subjective  "I am doing better but I do having pain in the foot and ankle at 5/10, the knee is getting better I know its more just strengthening"    Currently in Pain?  Yes    Pain Score  4     Pain Location  Knee    Pain Orientation  Right    Pain Descriptors / Indicators  Aching;Sore    Pain Type  Chronic pain    Pain Onset  More than a month ago    Pain Frequency  Intermittent    Aggravating Factors   walking/ running for long periods of time    Pain Relieving Factors  ice and medication                        [facility] Adult PT Treatment/Exercise - 03/05/18 1728      Self-Care   Self-Care  Other Self-Care Comments    Other Self-Care Comments   benefits of getting a wider shoe to reduce compression of the navicular to promote pain relief.       Exercises   Exercises   Ankle;Knee/Hip      Iontophoresis   Type of Iontophoresis  Dexamethasone    Location  to medial posterior schillies     Dose  4mg /ml    Time  6 hour stat patch      Manual Therapy   Manual Therapy  Taping;Other (comment);Soft tissue mobilization    Manual therapy comments  palpation and monitoring prior to and during TPDN    Soft tissue mobilization  IASTM along the R posterior tib    Other Manual Therapy  arch taping to provide traverse arch tape      Ankle Exercises: Stretches   Gastroc Stretch  2 reps;30 seconds      Ankle Exercises: Standing   Heel Raises  Both   3 sets with inversion to promote post tib strength      Trigger Point Dry Needling - 03/05/18 1727    Consent Given?  Yes    Education Handout Provided  Yes  Muscles Treated Lower Body  --   posterior tib on the R only          PT Education - 03/05/18 1726    Education Details  Dry needling benefits, what to expect and aftecare    Person(s) Educated  Patient    Methods  Explanation;Verbal cues;Handout;Demonstration    Comprehension  Verbalized understanding;Returned demonstration       PT Short Term Goals - 02/26/18 0908      PT SHORT TERM GOAL #1   Title  pt to be I with inital HEp given     Baseline  uodated HEP     Time  3    Period  Weeks    Status  On-going        PT Long Term Goals - 01/24/18 1550      PT LONG TERM GOAL #1   Title  pt to increase bil LE strength to >/= 4+/5 in all planes to promote hip/ knee stability with prlonged walking/ standing and running activities reporting  </= 1/10 pain     Time  6    Period  Weeks    Status  New    Target Date  03/07/18      PT LONG TERM GOAL #2   Title  pt will be able to perform jumping and dynamic activities reporting </= 1/10 pain for return to PLOF     Time  6    Period  Weeks    Status  New      PT LONG TERM GOAL #3   Title  He will be able to run for >/= 30 min  reporting </= 1/10 pain for pt's personal go of returning to  running activities     Time  6    Period  Weeks    Status  New    Target Date  03/07/18      PT LONG TERM GOAL #4   Title  Increase FOTO score to </= 20% limited to demo improvement in function     Time  6    Period  Weeks    Status  New    Target Date  03/07/18      PT LONG TERM GOAL #5   Title  pt to be I with all HEP given as of last visit to maintain and progress current level of function    Time  6    Period  Weeks    Status  New    Target Date  03/07/18            Plan - 03/05/18 1733    Clinical Impression Statement  pt reports continued pain in the achilles which he reports is a 5/10 today. educated and obtained conset for TPDN along the posterior tib followed with IASTM. pt demonstrated 1 cm arch drop on the R compared bil and trialed arch taping to provide support. he reported decreased pain with taping but notes worse pain with his shoes on, and may benefit from a wider shoe to reduce compression. worked on Engineer, [employer] and conitnued ionto end of session.     PT Treatment/Interventions  ADLs/Self Care Home Management;Cryotherapy;Electrical Stimulation;Moist Heat;Iontophoresis 4mg /ml Dexamethasone;Dry needling;Passive range of motion;Gait training;Stair training;Functional mobility training;Therapeutic activities;Therapeutic exercise;Manual techniques    PT Next Visit Plan  ERO, DN PRN, Assess tolerance to last treatment. Continue to work on LE stabilization,     PT Home Exercise Plan  quad set with ball squeeze,  hip ER in sidelhing, calf stretching, and sidelying hip abduction,     Consulted and Agree with Plan of Care  Patient       Patient will benefit from skilled therapeutic intervention in order to improve the following deficits and impairments:  Abnormal gait, Pain, Decreased strength, Decreased activity tolerance, Decreased balance, Postural dysfunction, Improper body mechanics  Visit Diagnosis: Chronic pain of right knee  Other abnormalities  of gait and mobility     Problem List Patient Active Problem List   Diagnosis Date Noted  . Fever 12/05/2010  . FOOT PAIN, LEFT 04/26/2010  . KNEE PAIN, RIGHT 02/21/2010  . ANKLE PAIN, LEFT 02/21/2010   Starr Lake PT, DPT, LAT, ATC  03/05/18  5:38 PM      [facility] [address] [place], [place], [address] Phone: [phone]   Fax:  [phone]  Name: ARAF CLUGSTON MRN: [id number] Date of Birth: February 09, 1981

## 2018-03-07 ENCOUNTER — Encounter: Payer: Self-pay | Admitting: Physical Therapy

## 2018-03-07 ENCOUNTER — Ambulatory Visit: Payer: 59 | Admitting: Physical Therapy

## 2018-03-07 DIAGNOSIS — G8929 Other chronic pain: Secondary | ICD-10-CM | POA: Diagnosis not present

## 2018-03-07 DIAGNOSIS — M25561 Pain in right knee: Secondary | ICD-10-CM | POA: Diagnosis not present

## 2018-03-07 DIAGNOSIS — R2689 Other abnormalities of gait and mobility: Secondary | ICD-10-CM | POA: Diagnosis not present

## 2018-03-07 NOTE — Therapy (Signed)
[place], [place], [address] Phone: [phone]   Fax:  [phone]  Physical Therapy Treatment  Patient Details  Name: Ronald Durham MRN: [id number] Date of Birth: [birth year] Referring Provider (PT): Karlton Lemon MD   Encounter Date: 03/07/2018  PT End of Session - 03/07/18 1635    Visit Number  5    Number of Visits  13    Date for PT Re-Evaluation  03/07/18    Authorization Type  [place] [employer]    PT Start Time  1635    PT Stop Time  1713    PT Time Calculation (min)  38 min    Activity Tolerance  Patient tolerated treatment well    Behavior During Therapy  [facility] for tasks assessed/performed       Past Medical History:  Diagnosis Date  . Acute medial meniscal tear   . Chicken pox   . Foot pain   . IT band syndrome     Past Surgical History:  Procedure Laterality Date  . ADENOIDECTOMY    . HERNIA REPAIR    . KNEE SURGERY    . MANDIBLE FRACTURE SURGERY  2000  . mensucal repair  1995    There were no vitals filed for this visit.  Subjective Assessment - 03/07/18 1637    Subjective  "I did try do some running 1 1/2 miles, and it didn't hurt"     Currently in Pain?  No/denies    Pain Onset  More than a month ago    Pain Frequency  Intermittent                       [facility] Adult PT Treatment/Exercise - 03/07/18 1718      Exercises   Exercises  Ankle;Knee/Hip      Knee/Hip Exercises: Standing   Forward Lunges  2 sets;15 reps;Both    Forward Lunges Limitations  using band wrapped around lead leg pulling medially to engage glute medius       Knee/Hip Exercises: Seated   Sit to Sand  2 sets;15 reps;without UE support   blue band around knees, 1 set with heels on 2 inch step     Manual Therapy   Manual therapy comments  palpation and monitoring prior to and during TPDN    Soft tissue mobilization  IASTM along the R calf with active release techniques      Ankle  Exercises: Seated   Heel Raises  20 reps;Both       Trigger Point Dry Needling - 03/07/18 1723    Consent Given?  Yes    Education Handout Provided  No    Muscles Treated Lower Body  Gastrocnemius;Soleus    Gastrocnemius Response  Twitch response elicited;Palpable increased muscle length    Soleus Response  Palpable increased muscle length;Twitch response elicited        Balance Exercises - 03/07/18 1718      Balance Exercises: Standing   Other Standing Exercises  hip 1/2 kneeling in tandem position on airex pad reaching contralateral arm cross body placing cones from stool to table.  2 x bil LE        PT Education - 03/07/18 1723    Education Details  lunge with band pulling lead knee medially to engage glute med    Person(s) Educated  Patient    Methods  Explanation;Verbal cues;Demonstration    Comprehension  Verbalized understanding;Verbal cues required;Returned demonstration  PT Short Term Goals - 03/07/18 1726      PT SHORT TERM GOAL #1   Title  pt to be I with inital HEp given     Time  3    Period  Weeks    Status  Achieved        PT Long Term Goals - 01/24/18 1550      PT LONG TERM GOAL #1   Title  pt to increase bil LE strength to >/= 4+/5 in all planes to promote hip/ knee stability with prlonged walking/ standing and running activities reporting  </= 1/10 pain     Time  6    Period  Weeks    Status  New    Target Date  03/07/18      PT LONG TERM GOAL #2   Title  pt will be able to perform jumping and dynamic activities reporting </= 1/10 pain for return to PLOF     Time  6    Period  Weeks    Status  New      PT LONG TERM GOAL #3   Title  He will be able to run for >/= 30 min  reporting </= 1/10 pain for pt's personal go of returning to running activities     Time  6    Period  Weeks    Status  New    Target Date  03/07/18      PT LONG TERM GOAL #4   Title  Increase FOTO score to </= 20% limited to demo improvement in function     Time   6    Period  Weeks    Status  New    Target Date  03/07/18      PT LONG TERM GOAL #5   Title  pt to be I with all HEP given as of last visit to maintain and progress current level of function    Time  6    Period  Weeks    Status  New    Target Date  03/07/18            Plan - 03/07/18 1724    Clinical Impression Statement  pt reports he ran 1 and 1/2 miles reporting no pain and hasn't been going to end range of his exercises as recommended last session and reported he has been feeling better. continued TPDN along gastroc/ soleus followed with IASTM techniques. focused on hip balance strategy and strengthening today. end of session he reported feeling sore inthe calves but no pain.     PT Next Visit Plan  ERO, DN PRN, Assess tolerance to last treatment. Continue to work on LE stabilization,     PT Home Exercise Plan  quad set with ball squeeze, hip ER in sidelhing, calf stretching, and sidelying hip abduction,     Consulted and Agree with Plan of Care  Patient       Patient will benefit from skilled therapeutic intervention in order to improve the following deficits and impairments:  Abnormal gait, Pain, Decreased strength, Decreased activity tolerance, Decreased balance, Postural dysfunction, Improper body mechanics  Visit Diagnosis: Chronic pain of right knee  Other abnormalities of gait and mobility     Problem List Patient Active Problem List   Diagnosis Date Noted  . Fever 12/05/2010  . FOOT PAIN, LEFT 04/26/2010  . KNEE PAIN, RIGHT 02/21/2010  . ANKLE PAIN, LEFT 02/21/2010   Starr Lake PT, DPT, LAT, ATC  03/07/18  5:27 PM      [place], [place], [address] Phone: [phone]   Fax:  [phone]  Name: Ronald Durham MRN: [id number] Date of Birth: [birth year]

## 2018-03-13 ENCOUNTER — Ambulatory Visit: Payer: 59 | Admitting: Physical Therapy

## 2018-03-14 ENCOUNTER — Ambulatory Visit: Payer: 59 | Admitting: Physical Therapy

## 2018-03-14 ENCOUNTER — Encounter: Payer: Self-pay | Admitting: Physical Therapy

## 2018-03-14 DIAGNOSIS — G8929 Other chronic pain: Secondary | ICD-10-CM

## 2018-03-14 DIAGNOSIS — R2689 Other abnormalities of gait and mobility: Secondary | ICD-10-CM

## 2018-03-14 DIAGNOSIS — M25561 Pain in right knee: Principal | ICD-10-CM

## 2018-03-14 NOTE — Therapy (Signed)
[place], [place], [address] Phone: [phone]   Fax:  [phone]  Physical Therapy Treatment / Discharge summary  Patient Details  Name: Ronald Durham MRN: [id number] Date of Birth: February 19, 1981 Referring Provider (PT): Karlton Lemon MD   Encounter Date: 03/14/2018  PT End of Session - 03/14/18 1635    Visit Number  6    Number of Visits  13    Date for PT Re-Evaluation  03/14/18    Authorization Type  [employer]    PT Start Time  1635    PT Stop Time  1705   shortened session due to discharge   PT Time Calculation (min)  30 min    Activity Tolerance  Patient tolerated treatment well    Behavior During Therapy  [facility] for tasks assessed/performed       Past Medical History:  Diagnosis Date  . Acute medial meniscal tear   . Chicken pox   . Foot pain   . IT band syndrome     Past Surgical History:  Procedure Laterality Date  . ADENOIDECTOMY    . HERNIA REPAIR    . KNEE SURGERY    . MANDIBLE FRACTURE SURGERY  2000  . mensucal repair  1995    There were no vitals filed for this visit.      [facility] PT Assessment - 03/14/18 1640      Observation/Other Assessments   Focus on Therapeutic Outcomes (FOTO)   17% limited      AROM   Right Knee Flexion  138      Strength   Right Hip Flexion  5/5    Right Hip External Rotation   5/5    Right Hip ADduction  5/5    Left Hip Extension  5/5    Left Hip External Rotation  5/5    Left Hip Internal Rotation  5/5    Left Hip ABduction  5/5    Left Hip ADduction  5/5                   [facility] Adult PT Treatment/Exercise - 03/14/18 0001      Exercises   Exercises  Ankle;Knee/Hip      Lumbar Exercises: Supine   Dead Bug  10 reps   with alternating UE reaching/ rope pull   Dead Bug Limitations  progress dynamic contrallateral UE/LE movement    Other Supine Lumbar Exercises  v- up passing ball between knees and UE's 1 x 10       Knee/Hip Exercises: Standing   Forward Lunges  1 set;10 reps;Both   lead leg on bosu    Lateral Step Up  2 sets;10 reps   on bosu   Forward Step Up  2 sets;10 reps   on Bosu             PT Education - 03/14/18 1710    Education Details  reviewed previously provided HEP, updated HEP for hip and core strengthening.     Person(s) Educated  Patient    Methods  Explanation;Verbal cues;Handout;Demonstration    Comprehension  Verbalized understanding;Verbal cues required;Returned demonstration       PT Short Term Goals - 03/07/18 1726      PT SHORT TERM GOAL #1   Title  pt to be I with inital HEp given     Time  3    Period  Weeks    Status  Achieved  PT Long Term Goals - 03/14/18 1646      PT LONG TERM GOAL #1   Title  pt to increase bil LE strength to >/= 4+/5 in all planes to promote hip/ knee stability with prlonged walking/ standing and running activities reporting  </= 1/10 pain     Time  6    Period  Weeks    Status  Achieved      PT LONG TERM GOAL #2   Title  pt will be able to perform jumping and dynamic activities reporting </= 1/10 pain for return to PLOF     Time  6    Period  Weeks    Status  Achieved      PT LONG TERM GOAL #3   Title  He will be able to run for >/= 30 min  reporting </= 1/10 pain for pt's personal go of returning to running activities     Time  6    Period  Weeks    Status  Achieved      PT LONG TERM GOAL #4   Title  Increase FOTO score to </= 20% limited to demo improvement in function     Time  6    Period  Weeks    Status  Achieved      PT LONG TERM GOAL #5   Title  pt to be I with all HEP given as of last visit to maintain and progress current level of function    Time  6    Status  Achieved            Plan - 03/14/18 1711    Clinical Impression Statement  Ronald Durham reports no pain in the ankle or knee today. He did return from a trip to [place] and reported one moment where the knee was sore and " slipped"  but it did occur after he had been hiking for a while and was likely due to fatigue. He was able to perform all exercises today with no report of pain. He met all goals today and is able to maintain current level of function and will be discharged from PT today.     PT Treatment/Interventions  ADLs/Self Care Home Management;Cryotherapy;Electrical Stimulation;Moist Heat;Iontophoresis 36m/ml Dexamethasone;Dry needling;Passive range of motion;Gait training;Stair training;Functional mobility training;Therapeutic activities;Therapeutic exercise;Manual techniques    PT Next Visit Plan  d/C    PT Home Exercise Plan  quad set with ball squeeze, hip ER in sidelhing, calf stretching, and sidelying hip abduction, dead bug with progression, V-up, lunge     Consulted and Agree with Plan of Care  Patient       Patient will benefit from skilled therapeutic intervention in order to improve the following deficits and impairments:  Abnormal gait, Pain, Decreased strength, Decreased activity tolerance, Decreased balance, Postural dysfunction, Improper body mechanics  Visit Diagnosis: Chronic pain of right knee  Other abnormalities of gait and mobility     Problem List Patient Active Problem List   Diagnosis Date Noted  . Fever 12/05/2010  . FOOT PAIN, LEFT 04/26/2010  . KNEE PAIN, RIGHT 02/21/2010  . ANKLE PAIN, LEFT 02/21/2010    LStarr Lake10/24/2019, 5:23 PM  [facility] [place] [address]Phone: [phone]  Fax:  [phone] Name: Ronald AWANMRN: [id number] of Birth: 1Jan 04, 1982      PHYSICAL THERAPY DISCHARGE SUMMARY  Visits from Start of Care: 6  Current  functional level related to goals / functional outcomes: See goals, 17% limited   Remaining deficits: N/A   Education / Equipment: HEP, theraband, core strength, posture  Plan: Patient agrees to discharge.  Patient goals were  met. Patient is being discharged due to meeting the stated rehab goals.  ?????         Maxon Kresse PT, DPT, LAT, ATC  03/14/18  5:23 PM

## 2018-04-12 DIAGNOSIS — N503 Cyst of epididymis: Secondary | ICD-10-CM | POA: Diagnosis not present

## 2018-07-24 ENCOUNTER — Telehealth: Payer: 59 | Admitting: Nurse Practitioner

## 2018-07-24 DIAGNOSIS — J0100 Acute maxillary sinusitis, unspecified: Secondary | ICD-10-CM

## 2018-07-24 MED ORDER — AMOXICILLIN-POT CLAVULANATE 875-125 MG PO TABS: 1.0000 | ORAL_TABLET | Freq: Two times a day (BID) | ORAL | 0 refills | Status: DC

## 2018-07-24 NOTE — Progress Notes (Signed)
We are sorry that you are not feeling well.  Here is how we plan to help!  Based on what you have shared with me it looks like you have sinusitis.  Sinusitis is inflammation and infection in the sinus cavities of the head.  Based on your presentation I believe you most likely have Acute Bacterial Sinusitis.  This is an infection caused by bacteria and is treated with antibiotics. I have prescribed Augmentin 875mg/125mg one tablet twice daily with food, for 7 days. You may use an oral decongestant such as Mucinex D or if you have glaucoma or high blood pressure use plain Mucinex. Saline nasal spray help and can safely be used as often as needed for congestion.  If you develop worsening sinus pain, fever or notice severe headache and vision changes, or if symptoms are not better after completion of antibiotic, please schedule an appointment with a health care provider.    Sinus infections are not as easily transmitted as other respiratory infection, however we still recommend that you avoid close contact with loved ones, especially the very young and elderly.  Remember to wash your hands thoroughly throughout the day as this is the number one way to prevent the spread of infection!  Home Care:  Only take medications as instructed by your medical team.  Complete the entire course of an antibiotic.  Do not take these medications with alcohol.  A steam or ultrasonic humidifier can help congestion.  You can place a towel over your head and breathe in the steam from hot water coming from a faucet.  Avoid close contacts especially the very young and the elderly.  Cover your mouth when you cough or sneeze.  Always remember to wash your hands.  Get Help Right Away If:  You develop worsening fever or sinus pain.  You develop a severe head ache or visual changes.  Your symptoms persist after you have completed your treatment plan.  Make sure you  Understand these instructions.  Will watch your  condition.  Will get help right away if you are not doing well or get worse.  Your e-visit answers were reviewed by a board certified advanced clinical practitioner to complete your personal care plan.  Depending on the condition, your plan could have included both over the counter or prescription medications.  If there is a problem please reply  once you have received a response from your provider.  Your safety is important to us.  If you have drug allergies check your prescription carefully.    You can use MyChart to ask questions about today's visit, request a non-urgent call back, or ask for a work or school excuse for 24 hours related to this e-Visit. If it has been greater than 24 hours you will need to follow up with your provider, or enter a new e-Visit to address those concerns.  You will get an e-mail in the next two days asking about your experience.  I hope that your e-visit has been valuable and will speed your recovery. Thank you for using e-visits.   5 minutes spent reviewing and documenting in chart.  

## 2018-08-12 ENCOUNTER — Other Ambulatory Visit: Payer: Self-pay | Admitting: Physician Assistant

## 2018-08-12 MED ORDER — DOXYCYCLINE HYCLATE 100 MG PO TABS: 100.0000 mg | ORAL_TABLET | Freq: Two times a day (BID) | ORAL | 0 refills | Status: DC

## 2018-08-27 ENCOUNTER — Other Ambulatory Visit: Payer: Self-pay

## 2018-08-27 ENCOUNTER — Ambulatory Visit (INDEPENDENT_AMBULATORY_CARE_PROVIDER_SITE_OTHER): Payer: Self-pay | Admitting: Physician Assistant

## 2018-08-27 VITALS — BP 108/64 | HR 52 | Temp 98.3°F | Resp 16

## 2018-08-27 DIAGNOSIS — J45990 Exercise induced bronchospasm: Secondary | ICD-10-CM

## 2018-08-27 MED ORDER — ALBUTEROL SULFATE HFA 108 (90 BASE) MCG/ACT IN AERS: 2.0000 | INHALATION_SPRAY | Freq: Four times a day (QID) | RESPIRATORY_TRACT | 0 refills | Status: AC | PRN

## 2018-08-27 NOTE — Patient Instructions (Signed)
Thank you for choosing [facility].Please follow up with primary care for future refills.    Exercise-Induced Bronchoconstriction, Adult  Exercise-induced bronchospasm (EIB) happens when the airways narrow during or after exercise. The airways are the passages that lead from the nose and mouth down into the lungs. When the airways narrow, this can cause coughing, wheezing, and shortness of breath. Anyone can develop this condition, even those who do not have asthma or allergies. To help prevent episodes of EIB, you may need to take medicine or change your workout routine. You should tell your coach, teammates, or workout partners about your condition so they know how to help you if you do have an episode. What are the causes? The exact cause of this condition is not known. Symptoms are brought on (triggered) by physical activity. EIB can also be triggered by dry air or by allergens and irritants, such as the chemicals used in pools and skating rinks. What increases the risk? The following factors may make you more likely to develop this condition:  Having asthma.  Exercising in dry air.  Exercising outdoors during allergy season.  Playing an outdoor sport that requires continuous motion. This includes sports such as soccer, hockey, and cross-country skiing. What are the signs or symptoms? Symptoms of this condition include:  Wheezing.  Coughing.  Shortness of breath.  Tightness in the chest.  Sore throat.  Upset stomach. How is this diagnosed? This condition may be diagnosed based on your symptoms, your medical history, and a physical exam. A test may be done to measure how exercise affects your breathing (spirometry test). For this test, you breathe into a device before and after exercising. How is this treated? Treatment for this condition may include:  Changing your exercise routine. You may have to: ? Spend a few minutes warming up before your workout. ? Exercise indoors  when the air is dry or during allergy season.  Taking medicine. Your health care provider may prescribe: ? An inhaler for you to use before you exercise. ? Oral medicine to control allergies and asthma. ? Inhaled steroids. Follow these instructions at home:  Take over-the-counter and prescription medicines only as told by your health care provider.  Keep all bronchospasm medicine with you during your workout.  Make changes in your workout as told by your health care provider.  Wear a medical ID bracelet. Tell your coach, trainer, or teammates about your condition.  If you are planning to exercise alone or in an isolated area, let someone know where you are going and when you will be back.  Keep all follow-up visits as told by your health care provider. This is important. How is this prevented?  Take medicines to prevent exercise-induced bronchospasm as told by your health care provider. Work with [employer] to make changes to your workout as needed.  If dry air triggers exercise-induced bronchospasm: ? Exercise indoors during peak allergy season and on days that are dry or cold. ? Try to breathe in warm, moist air by wearing a scarf over your nose and mouth or breathing only through your nose. Contact a health care provider if:  You have coughing, wheezing, or shortness of breath that continues after treatment.  Your coughing wakes you up at night.  You have less endurance than you used to. Get help right away if:  You cannot catch your breath.  You pass out. This information is not intended to replace advice given to you by your health care provider.  Make sure you discuss any questions you have with your health care provider. Document Released: 05/08/2005 Document Revised: 07/03/2016 Document Reviewed: 01/13/2015 Elsevier Interactive Patient Education  2019 [employer].

## 2018-08-27 NOTE — Progress Notes (Signed)
Ronald Durham  MRN: [id number] DOB: [birth year]  Subjective:  Ronald Durham is a 38 y.o. male seen in office today for a chief complaint of medication refill.  Needs refill on albuterol inhaler for intermittent exercise-induced asthma.  Diagnosed in 2004.  Has only had 3 inhalers since then.  He is very physically active.  Runs ~5-6 x per week.  Typically does not need to use inhaler every time before exercise.  Has to use it more frequently before exercise during changes in seasons.Denies shortness of breath, cough, wheezing, difficulty breathing, chest pain, heart rotations, nausea, vomiting, jitteriness, headache, fever, chills.  Has never been hospitalized for asthma exacerbation.  Has PMH of seasonal allergies.  Denies history of diabetes, hypertension, and heart disease.  Denies smoking.  Review of Systems  HENT: Negative for sinus pain and sore throat.   Respiratory: Negative for apnea, chest tightness and stridor.   Neurological: Negative for dizziness and headaches.    Patient Active Problem List   Diagnosis Date Noted  . Fever 12/05/2010  . FOOT PAIN, LEFT 04/26/2010  . KNEE PAIN, RIGHT 02/21/2010  . ANKLE PAIN, LEFT 02/21/2010    Current Outpatient Medications on File Prior to Visit  Medication Sig Dispense Refill  . amoxicillin-clavulanate (AUGMENTIN) 875-125 MG tablet Take 1 tablet by mouth 2 (two) times daily. (Patient not taking: Reported on 08/27/2018) 14 tablet 0  . doxycycline (VIBRA-TABS) 100 MG tablet Take 1 tablet (100 mg total) by mouth 2 (two) times daily. (Patient not taking: Reported on 08/27/2018) 20 tablet 0   No current facility-administered medications on file prior to visit.     No Known Allergies   Objective:  BP 108/64 (BP Location: Right Arm, Patient Position: Sitting, Cuff Size: Normal)   Pulse (!) 52   Temp 98.3 F (36.8 C) (Oral)   Resp 16   SpO2 97%   Physical Exam Vitals signs reviewed.  Constitutional:      General: He is not in acute  distress.    Appearance: He is well-developed. He is not ill-appearing or toxic-appearing.  HENT:     Head: Normocephalic and atraumatic.     Right Ear: Tympanic membrane, ear canal and external ear normal.     Left Ear: Tympanic membrane, ear canal and external ear normal.     Nose: Nose normal.     Right Sinus: No maxillary sinus tenderness or frontal sinus tenderness.     Left Sinus: No maxillary sinus tenderness or frontal sinus tenderness.     Mouth/Throat:     Lips: Pink.     Mouth: Mucous membranes are moist.     Pharynx: Oropharynx is clear. Uvula midline.     Tonsils: No tonsillar exudate or tonsillar abscesses.  Eyes:     Conjunctiva/sclera: Conjunctivae normal.  Neck:     Musculoskeletal: Normal range of motion.  Cardiovascular:     Rate and Rhythm: Normal rate and regular rhythm.     Heart sounds: Normal heart sounds.  Pulmonary:     Effort: Pulmonary effort is normal.     Breath sounds: Normal breath sounds. No decreased breath sounds, wheezing, rhonchi or rales.  Lymphadenopathy:     Head:     Right side of head: No submental, submandibular, tonsillar, preauricular, posterior auricular or occipital adenopathy.     Left side of head: No submental, submandibular, tonsillar, preauricular, posterior auricular or occipital adenopathy.     Cervical: No cervical adenopathy.     Upper Body:  Right upper body: No supraclavicular adenopathy.     Left upper body: No supraclavicular adenopathy.  Skin:    General: Skin is warm and dry.  Neurological:     Mental Status: He is alert.     Assessment and Plan :  1. Exercise induced bronchospasm Patient is overall well-appearing, no acute distress.  VSS.  He is asymptomatic today.  Normal physical exam.  Lungs CTAB.  Provided patient with a refill of albuterol inhaler.  Encouraged him to follow-up with PCP for additional refills in the future.  Patient voices understanding. - albuterol (PROVENTIL HFA;VENTOLIN HFA) 108 (90  Base) MCG/ACT inhaler; Inhale 2 puffs into the lungs every 6 (six) hours as needed for wheezing or shortness of breath.  Dispense: 1 Inhaler; Refill: [facility] 08/27/2018 2:50 PM

## 2018-09-10 ENCOUNTER — Encounter: Payer: Self-pay | Admitting: *Deleted

## 2018-09-10 ENCOUNTER — Ambulatory Visit (INDEPENDENT_AMBULATORY_CARE_PROVIDER_SITE_OTHER): Payer: 59 | Admitting: *Deleted

## 2018-09-10 ENCOUNTER — Other Ambulatory Visit: Payer: Self-pay

## 2018-09-10 DIAGNOSIS — Z111 Encounter for screening for respiratory tuberculosis: Secondary | ICD-10-CM | POA: Diagnosis not present

## 2018-09-10 NOTE — Addendum Note (Signed)
Addended by: Christen Bame D on: 09/10/2018 02:05 PM   Modules accepted: Orders

## 2018-09-10 NOTE — Progress Notes (Signed)
Opened in error. Christen Bame, CMA

## 2018-09-10 NOTE — Progress Notes (Signed)
Patient is here for a PPD placement.  PPD placed in right forearm.  Patient will return 09/12/18 to have PPD read. Christen Bame, CMA

## 2018-09-12 ENCOUNTER — Ambulatory Visit (INDEPENDENT_AMBULATORY_CARE_PROVIDER_SITE_OTHER): Payer: 59 | Admitting: *Deleted

## 2018-09-12 ENCOUNTER — Other Ambulatory Visit: Payer: Self-pay

## 2018-09-12 DIAGNOSIS — Z111 Encounter for screening for respiratory tuberculosis: Secondary | ICD-10-CM

## 2018-09-12 LAB — TB SKIN TEST
Induration: 0 mm
TB Skin Test: NEGATIVE

## 2018-09-12 NOTE — Progress Notes (Signed)
Patient is here for a PPD read.  It was placed on 09/10/18 in the right forearm @ 1:45pm.    PPD RESULTS:  Result: negative Induration: 0 mm  Letter created and given to patient for documentation purposes. Christen Bame, CMA

## 2018-10-17 IMAGING — MR MR KNEE*R* W/O CM
4 of 7 series · 19 of 40 positions shown · non-contrast
Comparison: None.

CLINICAL DATA: Right knee pain since 12/21/2017.

EXAM:
MRI OF THE RIGHT KNEE WITHOUT CONTRAST
TECHNIQUE: Multiplanar, multisequence MR imaging of the knee was performed. No
intravenous contrast was administered.

[Series 3: T2 fat-sat · axial · 4.0mm · 0.31mm/px · z∈[-43,+54]mm · 3 of 28 slices shown]
[im 6/28]
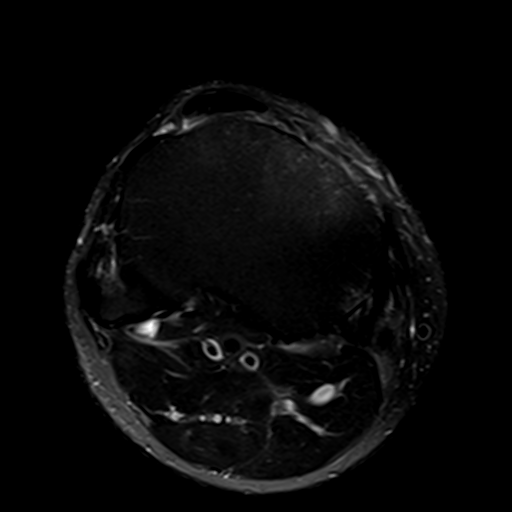
[im 17/28]
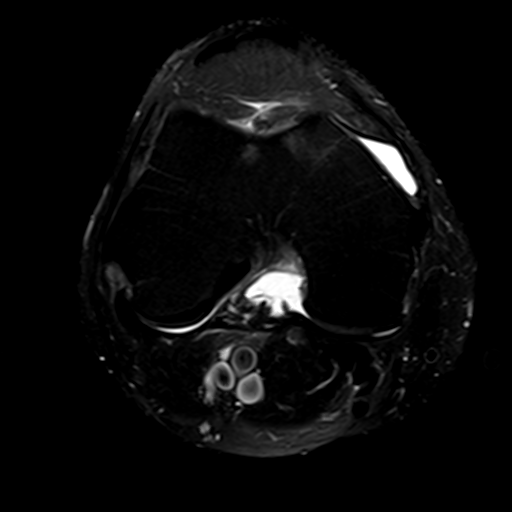
[im 28/28]
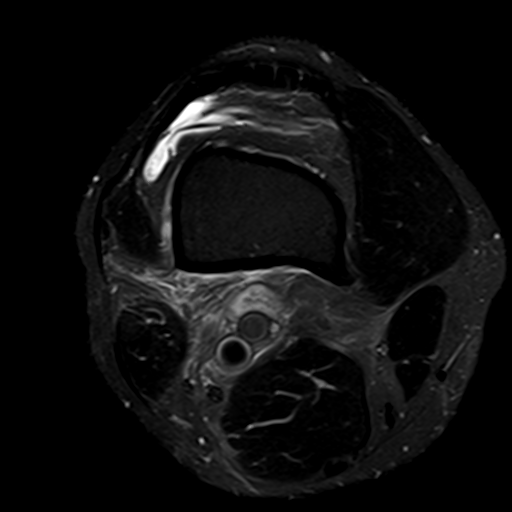

[Series 6: PD fat-sat · coronal · 3.0mm · 0.29mm/px · 7 of 33 slices shown (1 of 3)]
[im 1/33]
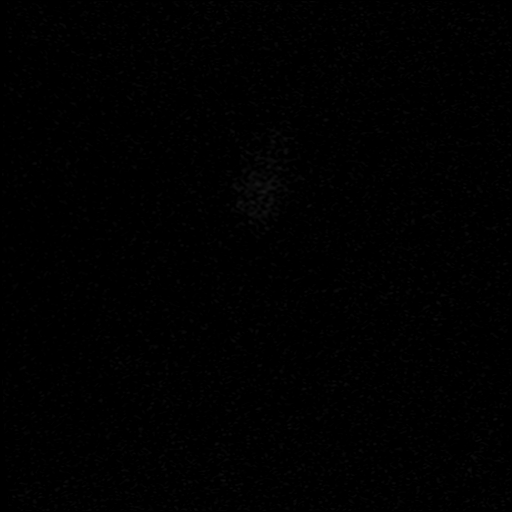
[im 6/33]
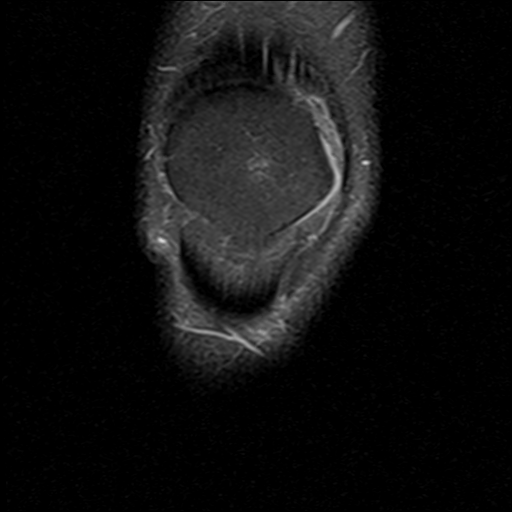
[im 11/33]
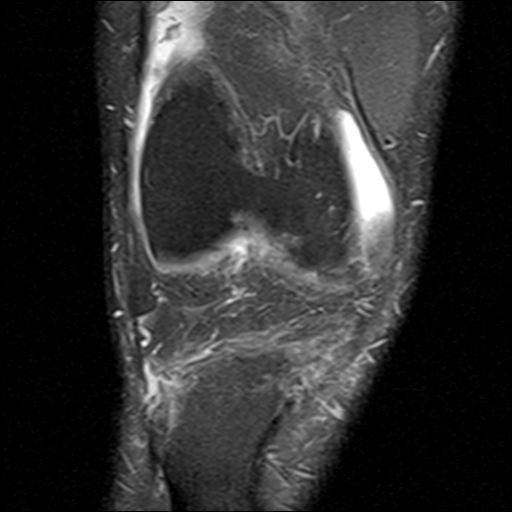
[im 17/33]
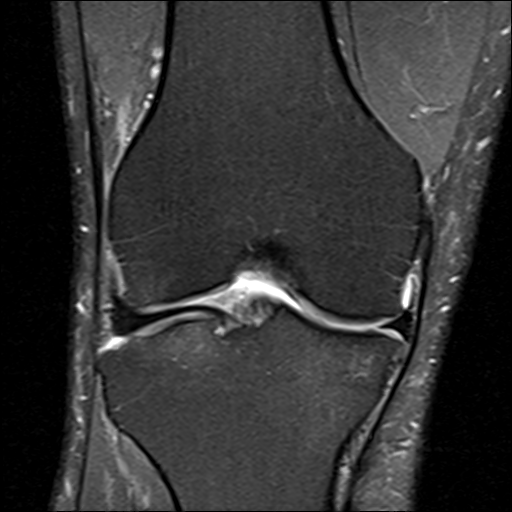
[im 22/33]
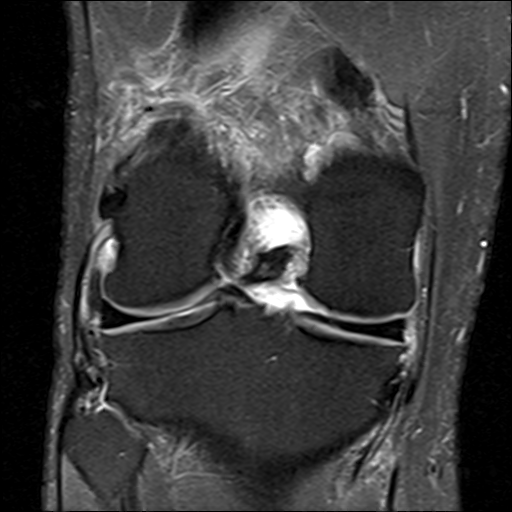
[im 27/33]
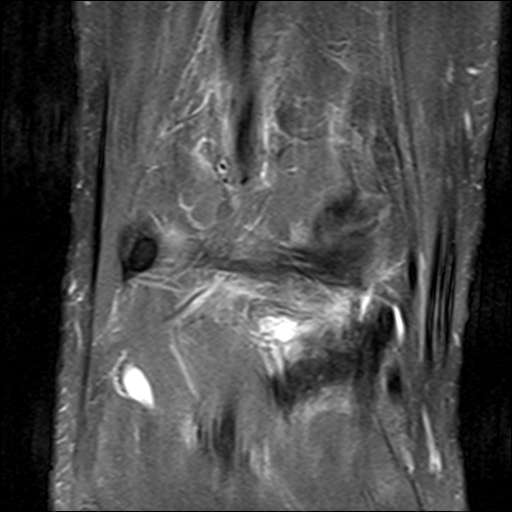
[im 33/33]
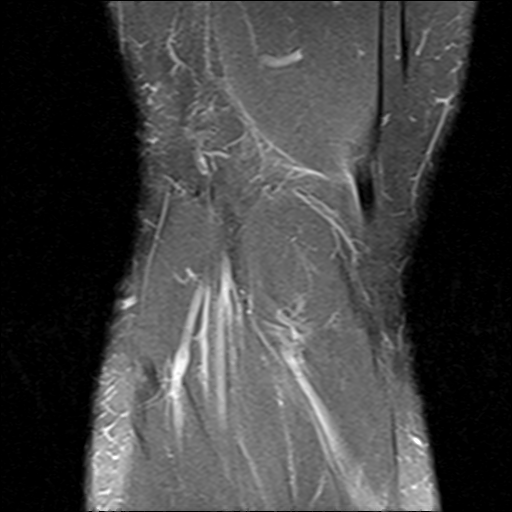

[Series 7: PD fat-sat · sagittal · 3.0mm · 0.29mm/px · 6 of 30 slices shown (2 of 3)]
[im 1/30]
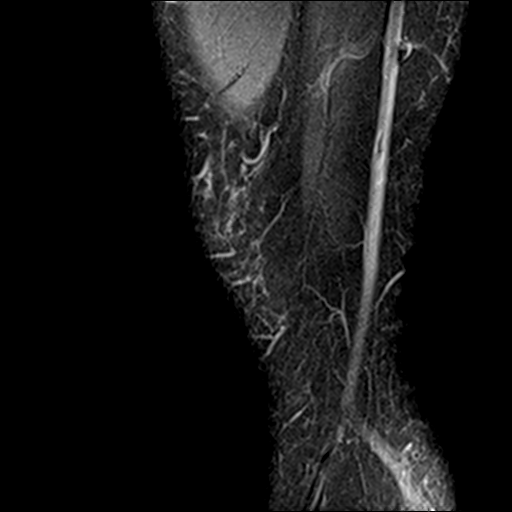
[im 6/30]
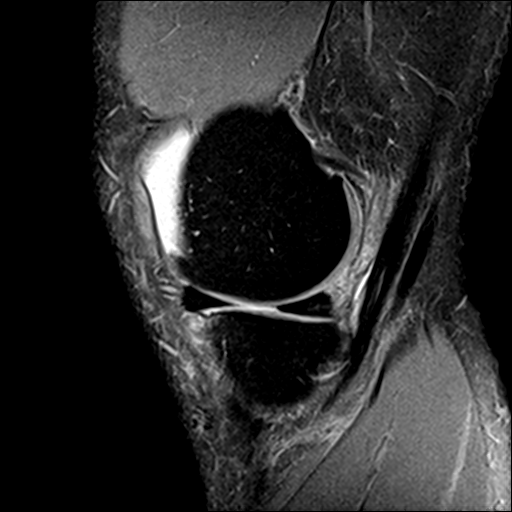
[im 12/30]
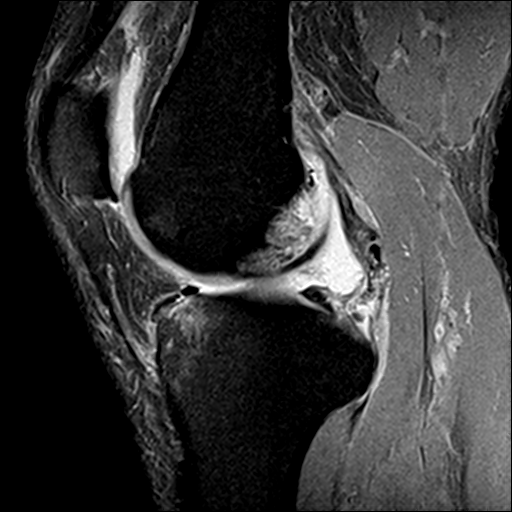
[im 18/30]
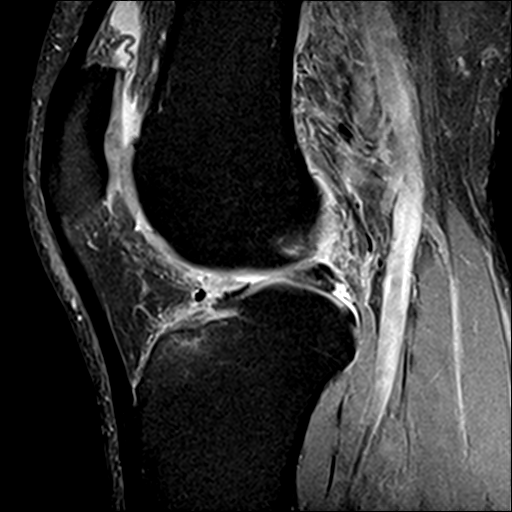
[im 24/30]
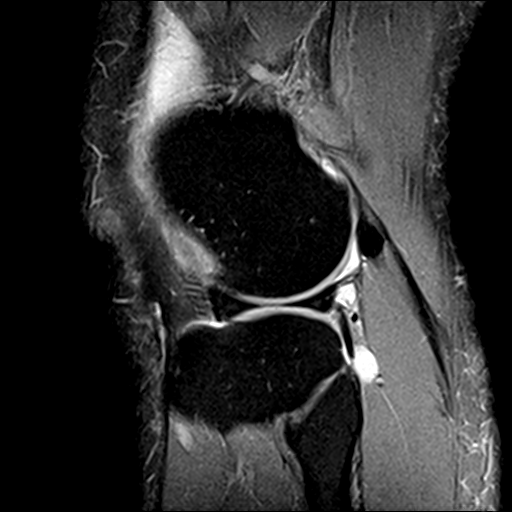
[im 30/30]
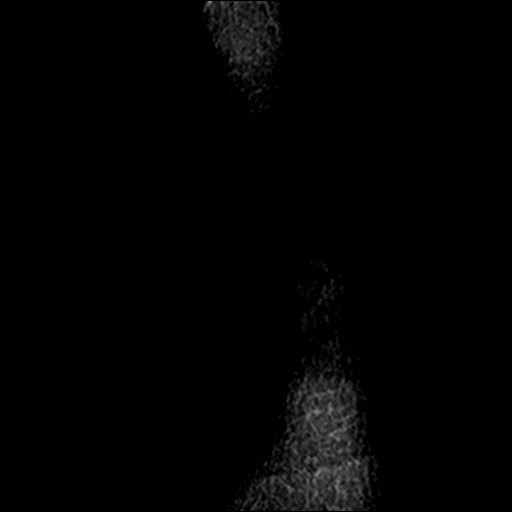

[Series 9: PD fat-sat · oblique · 2.0mm · 0.29mm/px · 3 of 14 slices shown (3 of 3)]
[im 1/14]
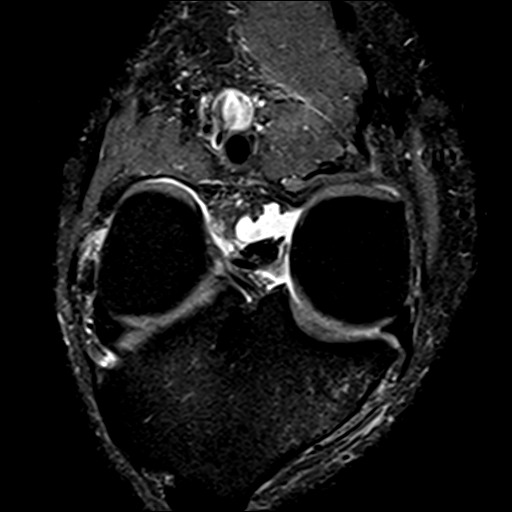
[im 7/14]
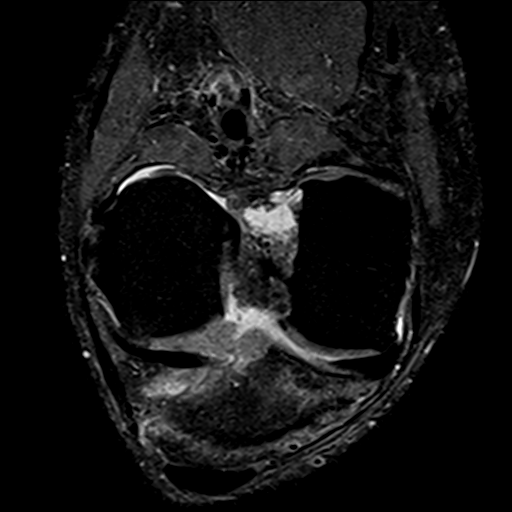
[im 14/14]
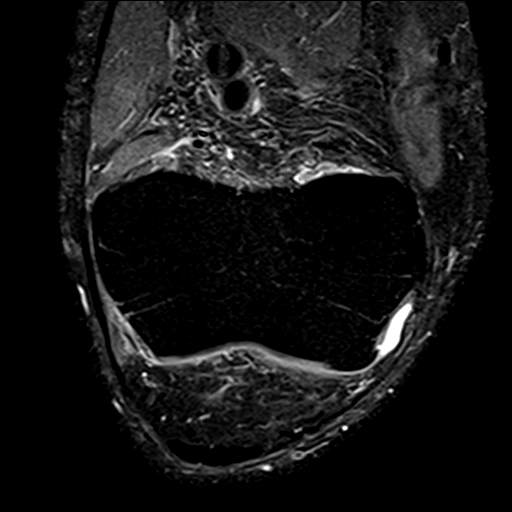

[19 of 40 positions shown; findings below may reference images not displayed]

FINDINGS: MENISCI

Medial meniscus:  Intact.

Lateral meniscus: Radial tear of the free edge of the posterior horn
of the lateral meniscus.

LIGAMENTS

Cruciates:  Intact ACL and PCL.

Collaterals: Medial collateral ligament is intact. Lateral
collateral ligament complex is intact.

CARTILAGE

Patellofemoral: Small cartilage fissure involving the lateral
patellar facet with mild subchondral reactive marrow edema.

Medial: Partial-thickness cartilage loss of the medial femoral
condyle and to lesser extent medial tibial plateau.

Lateral: Mild partial-thickness cartilage loss of the lateral
femorotibial compartment.

Joint: Moderate joint effusion. Normal Hoffa's fat. No plical
thickening.

Popliteal Fossa:  No Baker cyst. Intact popliteus tendon.

Extensor Mechanism: Intact quadriceps tendon. Intact patellar
tendon. Intact medial patellar retinaculum. Intact lateral patellar
retinaculum. Intact MPFL.

Bones: Mild marrow edema in the anterior medial and lateral tibial
plateau which may be secondary to hyperextension injury or direct
trauma.

Other: None.
IMPRESSION: 1. Radial tear of the free edge of the posterior horn of the lateral
meniscus.
2. Tricompartmental cartilage abnormalities as described above.

## 2018-10-28 ENCOUNTER — Ambulatory Visit (INDEPENDENT_AMBULATORY_CARE_PROVIDER_SITE_OTHER): Payer: Self-pay | Admitting: Nurse Practitioner

## 2018-10-28 ENCOUNTER — Other Ambulatory Visit: Payer: Self-pay

## 2018-10-28 VITALS — BP 100/70 | HR 54 | Temp 98.2°F | Wt 234.0 lb

## 2018-10-28 DIAGNOSIS — W57XXXA Bitten or stung by nonvenomous insect and other nonvenomous arthropods, initial encounter: Secondary | ICD-10-CM

## 2018-10-28 DIAGNOSIS — S30860A Insect bite (nonvenomous) of lower back and pelvis, initial encounter: Secondary | ICD-10-CM

## 2018-10-28 MED ORDER — DOXYCYCLINE HYCLATE 100 MG PO TABS: 100.0000 mg | ORAL_TABLET | Freq: Two times a day (BID) | ORAL | 0 refills | Status: AC

## 2018-10-28 MED FILL — DOXYCYCLINE HYCLATE 100 MG: 100 | 10 days supply | Qty: 20 | Fill #0

## 2018-10-28 NOTE — Progress Notes (Addendum)
Subjective:    Patient ID: Ronald Durham, male    DOB: 07-03-1980, 38 y.o.   MRN: [id number]  The patient is a 38 year old male who presents for complaints of a tick found on his left lower back/buttocks.  Patient informs he was out in the woods approximately 3 days ago.  Today the patient states that the tick was on his left lower back that was embedded.  The patient's significant other removed the tick, with some difficulty. States site was itchy, but denies rash.  The patient denies fever, chills, malaise, headache, dizziness, bull's-eye rash, fatigue, achiness, stiffness or swelling of the joints, nausea, vomiting, or abdominal pain. The tick had a white dot on its back and was very small, but patient is unsure of confirmed tick type.  Tick was able to be removed in its entirety pta.     Other  This is a new problem. The current episode started in the past 7 days. Pertinent negatives include no fatigue, fever, headaches, joint swelling, myalgias, nausea, neck pain, numbness, rash, vomiting or weakness.   Past Medical History:  Diagnosis Date  . Acute medial meniscal tear   . Chicken pox   . Foot pain   . IT band syndrome     Review of Systems  Constitutional: Negative for fatigue and fever.  HENT: Negative.   Respiratory: Negative.   Cardiovascular: Negative.   Gastrointestinal: Negative for nausea and vomiting.  Musculoskeletal: Negative for joint swelling, myalgias and neck pain.  Skin: Negative for rash.  Neurological: Negative for weakness, numbness and headaches.       Objective: Blood pressure 100/70, pulse (!) 54, temperature 98.2 F (36.8 C), weight 234 lb (106.1 kg), SpO2 97 %.   Physical Exam Vitals signs reviewed.  Constitutional:      General: He is not in acute distress. HENT:     Head: Normocephalic.     Right Ear: Tympanic membrane, ear canal and external ear normal.     Left Ear: Tympanic membrane, ear canal and external ear normal.     Nose: Nose  normal.     Mouth/Throat:     Pharynx: No oropharyngeal exudate or posterior oropharyngeal erythema.  Eyes:     Conjunctiva/sclera: Conjunctivae normal.     Pupils: Pupils are equal, round, and reactive to light.  Neck:     Musculoskeletal: Normal range of motion.  Cardiovascular:     Rate and Rhythm: Regular rhythm. Bradycardia present.     Pulses: Normal pulses.     Heart sounds: Normal heart sounds.  Pulmonary:     Effort: Pulmonary effort is normal. No respiratory distress.     Breath sounds: Normal breath sounds. No stridor. No wheezing or rhonchi.  Abdominal:     General: Bowel sounds are normal.     Tenderness: There is no abdominal tenderness.  Musculoskeletal: Normal range of motion.        General: No swelling or tenderness.     Right lower leg: No edema.     Left lower leg: No edema.  Lymphadenopathy:     Cervical: No cervical adenopathy.  Skin:    General: Skin is warm and dry.  Neurological:     General: No focal deficit present.     Mental Status: He is alert and oriented to person, place, and time.  Psychiatric:        Mood and Affect: Mood normal.        Behavior: Behavior normal.  Assessment & Plan:   Exam findings, diagnosis etiology and medication use and indications reviewed with patient. Follow- Up and discharge instructions provided. No emergent/urgent issues found on exam. I am going to treat the patient with Doxycycline for prophylaxis since the tick was well attached for 72 hours. Tick classification was not identified or confirmed. I am also going to place the patient on Doxycycline as a Lyme test or RMSF cannot be performed from our office. Tick was successfully removed but with some difficulty.  The patient remains asymptomatic at this time.   The patient was instructed to continue to monitor the site for development of a bull's eye and other symptoms to include fever, HA, joint pain/swelling, fatigue, achiness, flu-like symptoms or other  concerns.   Patient education was provided. Patient verbalized understanding of information provided and agrees with plan of care (POC), all questions answered. The patient is advised to call or return to clinic if condition does not see an improvement in symptoms, or to seek the care of the closest emergency department if condition worsens with the above plan.   1. Tick bite of buttock, initial encounter  - doxycycline (VIBRA-TABS) 100 MG tablet; Take 1 tablet (100 mg total) by mouth 2 (two) times daily for 10 days.  Dispense: 20 tablet; Refill: 0 -Take medication as prescribed. -Ibuprofen or Tylenol for pain, fever or general discomfort. -Continue to monitor for symptoms of fever, chills, H/A, bull's eye rash, myalgia, malaise, nausea/vomiting. -Increase fluids. -Get plenty of rest. -To avoid further risk for tick bites, check for ticks after coming from outside.   -Place clothing in a hot clothes dryer for a short period after being outside. -Wear protective clothing. -Use tick repellents such as DEET when outside. -Follow up for worsening symptoms or other concerns.

## 2018-10-28 NOTE — Patient Instructions (Addendum)
Tick Bite Information, Adult -Take medication as prescribed. -Ibuprofen or Tylenol for pain, fever or general discomfort. -Continue to monitor for symptoms of fever, chills, H/A, bull's eye rash, myalgia, malaise, nausea/vomiting. -Increase fluids. -Get plenty of rest. -To avoid further risk for tick bites, check for ticks after coming from outside.   -Place clothing in a hot clothes dryer for a short period after being outside. -Wear protective clothing. -Use tick repellents such as DEET when outside. -Follow up for worsening symptoms or other concerns.    Ticks are insects that can bite. Most ticks live in shrubs and grassy areas. They climb onto people and animals that go by. Then they bite. Some ticks carry germs that can make you sick. How can I prevent tick bites?  Use an insect repellent that has 20% or higher of the ingredients DEET, picaridin, or IR3535. Put this insect repellent on: ? Bare skin. ? The tops of your boots. ? Your pant legs. ? The ends of your sleeves.  If you use an insect repellent that has the ingredient permethrin, make sure to follow the instructions on the bottle. Treat the following: ? Clothing. ? Supplies. ? Boots. ? Tents.  Wear long sleeves, long pants, and light colors.  Tuck your pant legs into your socks.  Stay in the middle of the trail.  Try not to walk through long grass.  Before going inside your house, check your clothes, hair, and skin for ticks. Make sure to check your head, neck, armpits, waist, groin, and joint areas.  Check for ticks every day.  When you come indoors: ? Wash your clothes right away. ? Shower right away. ? Dry your clothes in a dryer on high heat for 60 minutes or more. What is the right way to remove a tick? Remove a tick from your skin as soon as possible.  To remove a tick that is crawling on your skin: ? Go outdoors and brush the tick off. ? Use tape or a lint roller.  To remove a tick that is  biting: ? Wash your hands. ? If you have latex gloves, put them on. ? Use tweezers, curved forceps, or a tick-removal tool to grasp the tick. Grasp the tick as close to your skin and as close to the tick's head as possible. ? Gently pull up until the tick lets go.  Try to keep the tick's head attached to its body.  Do not twist or jerk the tick.  Do not squeeze or crush the tick. Do not try to remove a tick with heat, alcohol, petroleum jelly, or fingernail polish. How should I get rid of a tick? Here are some ways to get rid of a tick that is alive:  Place the tick in rubbing alcohol.  Place the tick in a bag or container you can close tightly.  Wrap the tick tightly in tape.  Flush the tick down the toilet. Contact a doctor if:  You have symptoms of a disease, such as: ? Pain in a muscle, joint, or bone. ? Trouble walking or moving your legs. ? Numbness in your legs. ? Inability to move (paralysis). ? A red rash that makes a circle (bull's-eye rash). ? Redness and swelling where the tick bit you. ? A fever. ? Throwing up (vomiting) over and over. ? Diarrhea. ? Weight loss. ? Tender and swollen lymph glands. ? Shortness of breath. ? Cough. ? Belly pain (abdominal pain). ? Headache. ? Being more tired than normal. ?  A change in how alert (conscious) you are. ? Confusion. Get help right away if:  You cannot remove a tick.  A part of a tick breaks off and gets stuck in your skin.  You are feeling worse. Summary  Ticks may carry germs that can make you sick.  To prevent tick bites, wear long sleeves, long pants, and light colors. Use insect repellent. Follow the instructions on the bottle.  If the tick is biting, do not try to remove it with heat, alcohol, petroleum jelly, or fingernail polish.  Use tweezers, curved forceps, or a tick-removal tool to grasp the tick. Gently pull up until the tick lets go. Do not twist or jerk the tick. Do not squeeze or crush the  tick.  If you have symptoms, contact a doctor. This information is not intended to replace advice given to you by your health care provider. Make sure you discuss any questions you have with your health care provider. Document Released: 08/02/2009 Document Revised: 08/18/2016 Document Reviewed: 08/18/2016 [employer] Interactive Patient Education  2019 [employer].
